# Patient Record
Sex: Male | Born: 1970
Health system: Southern US, Community
[De-identification: ages and names within clinical notes are randomized; demographics above are authoritative.]

## PROBLEM LIST (undated history)

## (undated) DIAGNOSIS — G4730 Sleep apnea, unspecified: Secondary | ICD-10-CM

## (undated) DIAGNOSIS — J45909 Unspecified asthma, uncomplicated: Secondary | ICD-10-CM

## (undated) DIAGNOSIS — T7840XA Allergy, unspecified, initial encounter: Secondary | ICD-10-CM

## (undated) DIAGNOSIS — E119 Type 2 diabetes mellitus without complications: Secondary | ICD-10-CM

## (undated) DIAGNOSIS — I10 Essential (primary) hypertension: Secondary | ICD-10-CM

## (undated) HISTORY — DX: Sleep apnea, unspecified: G47.30

## (undated) HISTORY — DX: Allergy, unspecified, initial encounter: T78.40XA

---
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
## 1999-01-22 ENCOUNTER — Emergency Department (HOSPITAL_COMMUNITY): Admission: EM | Admit: 1999-01-22 | Discharge: 1999-01-22 | Payer: Self-pay | Admitting: Emergency Medicine

## 1999-09-11 ENCOUNTER — Emergency Department (HOSPITAL_COMMUNITY): Admission: EM | Admit: 1999-09-11 | Discharge: 1999-09-11 | Payer: Self-pay | Admitting: Emergency Medicine

## 1999-09-11 ENCOUNTER — Encounter: Payer: Self-pay | Admitting: Emergency Medicine

## 2002-09-13 ENCOUNTER — Ambulatory Visit (HOSPITAL_BASED_OUTPATIENT_CLINIC_OR_DEPARTMENT_OTHER): Admission: RE | Admit: 2002-09-13 | Discharge: 2002-09-13 | Payer: Self-pay | Admitting: *Deleted

## 2003-03-21 HISTORY — PX: ACHILLES TENDON SURGERY: SHX542

## 2005-12-26 ENCOUNTER — Ambulatory Visit (HOSPITAL_BASED_OUTPATIENT_CLINIC_OR_DEPARTMENT_OTHER): Admission: RE | Admit: 2005-12-26 | Discharge: 2005-12-26 | Payer: Self-pay | Admitting: Orthopedic Surgery

## 2011-10-19 ENCOUNTER — Other Ambulatory Visit: Payer: Self-pay | Admitting: Podiatry

## 2011-10-19 DIAGNOSIS — S86009A Unspecified injury of unspecified Achilles tendon, initial encounter: Secondary | ICD-10-CM

## 2011-10-25 ENCOUNTER — Other Ambulatory Visit: Payer: Self-pay | Admitting: Podiatry

## 2011-10-25 DIAGNOSIS — S86009A Unspecified injury of unspecified Achilles tendon, initial encounter: Secondary | ICD-10-CM

## 2011-10-27 ENCOUNTER — Ambulatory Visit (HOSPITAL_COMMUNITY)
Admission: RE | Admit: 2011-10-27 | Discharge: 2011-10-27 | Disposition: A | Discharge status: Home / Self Care | Payer: 59 | Source: Ambulatory Visit | Attending: Podiatry | Admitting: Podiatry

## 2011-10-27 DIAGNOSIS — S8990XA Unspecified injury of unspecified lower leg, initial encounter: Secondary | ICD-10-CM | POA: Insufficient documentation

## 2011-10-27 DIAGNOSIS — S99919A Unspecified injury of unspecified ankle, initial encounter: Secondary | ICD-10-CM | POA: Insufficient documentation

## 2011-10-27 DIAGNOSIS — X58XXXA Exposure to other specified factors, initial encounter: Secondary | ICD-10-CM | POA: Insufficient documentation

## 2011-10-27 DIAGNOSIS — S86009A Unspecified injury of unspecified Achilles tendon, initial encounter: Secondary | ICD-10-CM

## 2011-10-27 DIAGNOSIS — M7730 Calcaneal spur, unspecified foot: Secondary | ICD-10-CM | POA: Insufficient documentation

## 2013-02-25 ENCOUNTER — Emergency Department (HOSPITAL_COMMUNITY): Payer: 59

## 2013-02-25 ENCOUNTER — Emergency Department (HOSPITAL_COMMUNITY)
Admission: EM | Admit: 2013-02-25 | Discharge: 2013-02-26 | Disposition: A | Discharge status: Home / Self Care | Payer: 59 | Attending: Emergency Medicine | Admitting: Emergency Medicine

## 2013-02-25 ENCOUNTER — Encounter (HOSPITAL_COMMUNITY): Payer: Self-pay | Admitting: Emergency Medicine

## 2013-02-25 DIAGNOSIS — S63619A Unspecified sprain of unspecified finger, initial encounter: Secondary | ICD-10-CM

## 2013-02-25 DIAGNOSIS — S6390XA Sprain of unspecified part of unspecified wrist and hand, initial encounter: Secondary | ICD-10-CM | POA: Insufficient documentation

## 2013-02-25 DIAGNOSIS — J45909 Unspecified asthma, uncomplicated: Secondary | ICD-10-CM | POA: Insufficient documentation

## 2013-02-25 DIAGNOSIS — Z79899 Other long term (current) drug therapy: Secondary | ICD-10-CM | POA: Insufficient documentation

## 2013-02-25 DIAGNOSIS — E119 Type 2 diabetes mellitus without complications: Secondary | ICD-10-CM | POA: Insufficient documentation

## 2013-02-25 DIAGNOSIS — Y9367 Activity, basketball: Secondary | ICD-10-CM | POA: Insufficient documentation

## 2013-02-25 DIAGNOSIS — W219XXA Striking against or struck by unspecified sports equipment, initial encounter: Secondary | ICD-10-CM | POA: Insufficient documentation

## 2013-02-25 DIAGNOSIS — Y9239 Other specified sports and athletic area as the place of occurrence of the external cause: Secondary | ICD-10-CM | POA: Insufficient documentation

## 2013-02-25 DIAGNOSIS — I10 Essential (primary) hypertension: Secondary | ICD-10-CM | POA: Insufficient documentation

## 2013-02-25 HISTORY — DX: Type 2 diabetes mellitus without complications: E11.9

## 2013-02-25 HISTORY — DX: Essential (primary) hypertension: I10

## 2013-02-25 HISTORY — DX: Unspecified asthma, uncomplicated: J45.909
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
## 2013-02-25 NOTE — ED Provider Notes (Signed)
CSN: [id number]     Arrival date & time 02/25/13  2219 History  This chart was scribed for Ivonne Andrew, PA, working with Shon Baton, MD, by Ardelia Mems ED Scribe. This patient was seen in room WTR8/WTR8 and the patient's care was started at 10:58 PM.   Chief Complaint  Patient presents with  . Finger Injury    The history is provided by the patient. No language interpreter was used.    HPI Comments: Drew Daniels is a right-handed 42 y.o. Male with a history of DM and HTN who presents to the Emergency Department complaining of a right fifth finger injury sustained 2 hours ago. Pain is moderate. He states that he "jammed" the finger while playing Basketball. He states that the finger has been "crooked" and difficult to move since the injury. He states that he has had intermittent pain only with moving his finger. He also states that he has not had full ROM of the finger since the injury. He has buddy taped the finger to the adjacent finger. No other treatments use. Denies any weakness or numbness to the finger. No other aggravating or alleviating factors. No other associated symptoms.  PCP- Dr. Sharyon Medicus   Past Medical History  Diagnosis Date  . Diabetes mellitus without complication   . Hypertension   . Asthma    Past Surgical History  Procedure Laterality Date  . Achilles tendon surgery Left 2005   History reviewed. No pertinent family history. History  Substance Use Topics  . Smoking status: Never Smoker   . Smokeless tobacco: Never Used  . Alcohol Use: No    Review of Systems  Musculoskeletal:       Right fifth finger pain.  All other systems reviewed and are negative.   Allergies  Pseudophed-chlophedianol-gg  Home Medications   Current Outpatient Rx  Name  Route  Sig  Dispense  Refill  . cholecalciferol (VITAMIN D) 1000 UNITS tablet   Oral   Take 1,000 Units by mouth daily.         . nebivolol (BYSTOLIC) 10 MG tablet   Oral   Take 10 mg by  mouth every evening.         . Olmesartan-Amlodipine-HCTZ (TRIBENZOR) 40-10-25 MG TABS   Oral   Take by mouth.         . sitaGLIPtin-metformin (JANUMET) 50-500 MG per tablet   Oral   Take 1 tablet by mouth 2 (two) times daily with a meal.          Triage Vitals: BP 131/66  Pulse 78  Temp(Src) 99 F (37.2 C) (Oral)  Resp 18  Ht 6' (1.829 m)  Wt 309 lb (140.161 kg)  BMI 41.90 kg/m2  SpO2 99%  Physical Exam  Nursing note and vitals reviewed. Constitutional: He is oriented to person, place, and time. He appears well-developed and well-nourished. No distress.  HENT:  Head: Normocephalic and atraumatic.  Eyes: EOM are normal.  Neck: Neck supple. No tracheal deviation present.  Cardiovascular: Normal rate.   Pulmonary/Chest: Effort normal. No respiratory distress.  Musculoskeletal: Normal range of motion.  Decreased range of motion of the right fifth digit. TVery mild swelling present. There is pain to the dorsal proximal finger. No metatarsal pain or deformity. Normal sensations to light touch. Normal capillary refill less than 2 seconds.  Neurological: He is alert and oriented to person, place, and time.  Skin: Skin is warm and dry.  Psychiatric: He has a normal mood  and affect. His behavior is normal.    ED Course  Procedures   DIAGNOSTIC STUDIES: Oxygen Saturation is 99% on RA, normal by my interpretation.    COORDINATION OF CARE: 11:02 PM- patient seen in 9 patient appears well no acute distress. Pt advised of plan for treatment and pt agrees.    Imaging Review Dg Finger Little Right  02/25/2013   CLINICAL DATA:  Injured finger while playing basketball. Pain in the base of the finger.  EXAM: RIGHT LITTLE FINGER 2+V  COMPARISON:  None.  FINDINGS: There is no evidence of fracture or dislocation. There is no evidence of arthropathy or other focal bone abnormality. Soft tissues are unremarkable.  IMPRESSION: Negative.   Electronically Signed   By: Rosalie Gums M.D.    On: 02/25/2013 23:38      MDM   1. Finger sprain, initial encounter       I personally performed the services described in this documentation, which was scribed in my presence. The recorded information has been reviewed and is accurate.   Angus Seller, PA-C 02/26/13 [phone]

## 2013-02-26 NOTE — ED Notes (Signed)
Patient is alert and oriented x3.  He was given DC instructions and follow up visit instructions.  Patient gave verbal understanding.  He was DC ambulatory under his own power to home.  V/S stable.  He was not showing any signs of distress on DC 

## 2013-02-26 NOTE — ED Provider Notes (Signed)
Medical screening examination/treatment/procedure(s) were performed by non-physician practitioner and as supervising physician I was immediately available for consultation/collaboration.  EKG Interpretation   None        Shon Baton, MD 02/26/13 [phone]

## 2014-08-23 ENCOUNTER — Encounter (HOSPITAL_COMMUNITY): Payer: Self-pay | Admitting: *Deleted

## 2014-08-23 ENCOUNTER — Emergency Department (INDEPENDENT_AMBULATORY_CARE_PROVIDER_SITE_OTHER)
Admission: EM | Admit: 2014-08-23 | Discharge: 2014-08-23 | Disposition: A | Discharge status: Home / Self Care | Payer: 59 | Source: Home / Self Care | Attending: Family Medicine | Admitting: Family Medicine

## 2014-08-23 DIAGNOSIS — L03012 Cellulitis of left finger: Secondary | ICD-10-CM | POA: Diagnosis not present

## 2014-08-23 DIAGNOSIS — IMO0001 Reserved for inherently not codable concepts without codable children: Secondary | ICD-10-CM

## 2014-08-23 MED ORDER — DOXYCYCLINE HYCLATE 100 MG PO CAPS: 100.0000 mg | ORAL_CAPSULE | Freq: Two times a day (BID) | ORAL | Status: DC

## 2014-08-23 NOTE — ED Notes (Signed)
Pain       And  Swelling  Of  The  Base  Of  His  l middle finger    For  sev  Days         He  Reports  He  Has been  Soaking  It  For     aboy  3  Days  Ago  And  Has  Had  Some   pus  Drainage

## 2014-08-23 NOTE — ED Provider Notes (Signed)
CSN: [id number]     Arrival date & time 08/23/14  1713 History   First MD Initiated Contact with Patient 08/23/14 1725     Chief Complaint  Patient presents with  . Hand Pain   (Consider location/radiation/quality/duration/timing/severity/associated sxs/prior Treatment) Patient is a 44 y.o. male presenting with hand pain. The history is provided by the patient.  Hand Pain This is a new problem. The current episode started 2 days ago (left middle fingertip pain and swelling.). The problem has been gradually worsening.    Past Medical History  Diagnosis Date  . Diabetes mellitus without complication   . Hypertension   . Asthma    Past Surgical History  Procedure Laterality Date  . Achilles tendon surgery Left 2005   No family history on file. History  Substance Use Topics  . Smoking status: Never Smoker   . Smokeless tobacco: Never Used  . Alcohol Use: No    Review of Systems  Constitutional: Negative.   Musculoskeletal: Positive for joint swelling.  Skin: Negative.     Allergies  Pseudophed-chlophedianol-gg  Home Medications   Prior to Admission medications   Medication Sig Start Date End Date Taking? Authorizing Provider  levocetirizine (XYZAL) 2.5 MG/5ML solution Take 2.5 mg by mouth every evening.   Yes Historical Provider, MD  cholecalciferol (VITAMIN D) 1000 UNITS tablet Take 1,000 Units by mouth daily.    Historical Provider, MD  doxycycline (VIBRAMYCIN) 100 MG capsule Take 1 capsule (100 mg total) by mouth 2 (two) times daily. 08/23/14   Billy Fischer, MD  nebivolol (BYSTOLIC) 10 MG tablet Take 10 mg by mouth every evening.    Historical Provider, MD  Olmesartan-Amlodipine-HCTZ (TRIBENZOR) 40-10-25 MG TABS Take by mouth.    Historical Provider, MD  sitaGLIPtin-metformin (JANUMET) 50-500 MG per tablet Take 1 tablet by mouth 2 (two) times daily with a meal.    Historical Provider, MD   There were no vitals taken for this visit. Physical Exam  Constitutional: He  is oriented to person, place, and time. He appears well-developed and well-nourished.  Musculoskeletal: He exhibits tenderness.       Hands: Neurological: He is alert and oriented to person, place, and time.  Skin: Skin is warm and dry. There is erythema.  Nursing note and vitals reviewed.   ED Course  INCISION AND DRAINAGE Date/Time: 08/23/2014 5:45 PM Performed by: Billy Fischer Authorized by: Ihor Gully D Consent: Verbal consent obtained. Risks and benefits: risks, benefits and alternatives were discussed Consent given by: patient Type: abscess Body area: upper extremity Location details: left long finger Local anesthetic: topical anesthetic Patient sedated: no Scalpel size: 11 Incision type: single straight Complexity: simple Drainage: purulent Drainage amount: moderate Wound treatment: wound left open Packing material: 1/4 in gauze Patient tolerance: Patient tolerated the procedure well with no immediate complications Comments: Culture obtained, betadine soaked.   (including critical care time) Labs Review Labs Reviewed  WOUND CULTURE    Imaging Review No results found.   MDM   1. Paronychia of third finger, left        Billy Fischer, MD 08/23/14 1750

## 2014-08-27 LAB — WOUND CULTURE: Gram Stain: NONE SEEN

## 2014-08-27 NOTE — ED Notes (Signed)
Final wound culture report shows organism identified sensitive to Rx written. No further action required

## 2015-01-08 IMAGING — CR DG FINGER LITTLE 2+V*R*
3 series · 3 of 3 positions shown · non-contrast
Comparison: None.

CLINICAL DATA: Injured finger while playing basketball. Pain in the
base of the finger.

EXAM:
RIGHT LITTLE FINGER 2+V

[x finger pa right]
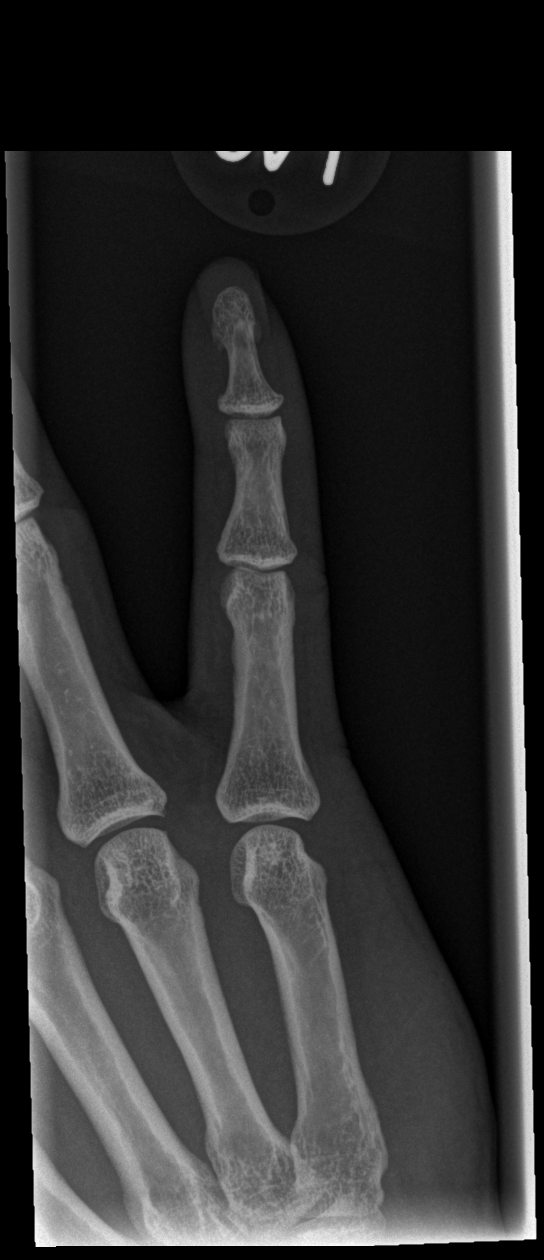

[x finger obl right]
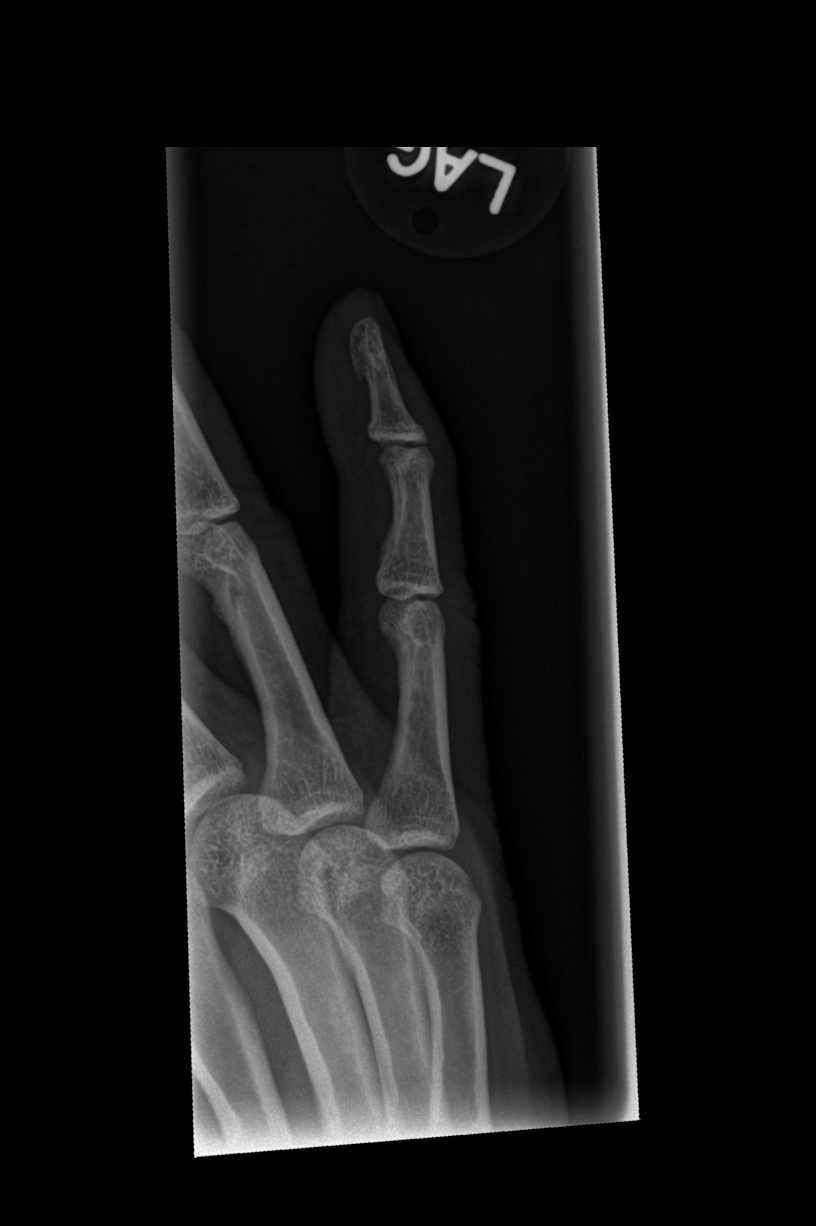

[x finger lat right]
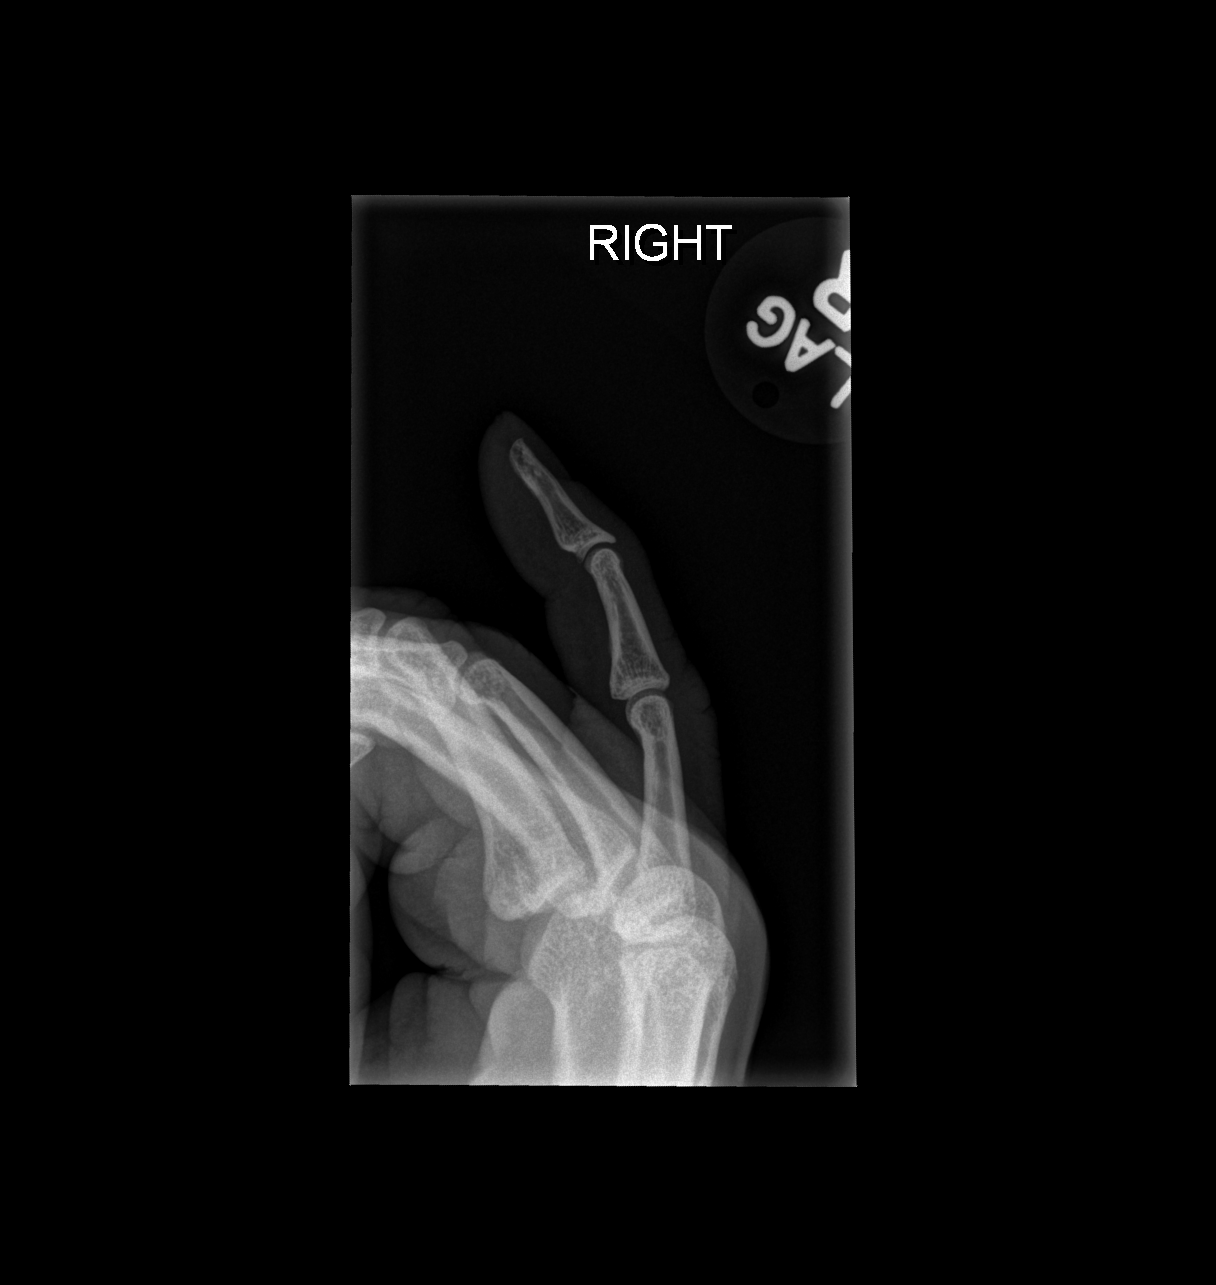

[3 of 3 positions shown; findings below may reference images not displayed]

FINDINGS: There is no evidence of fracture or dislocation. There is no
evidence of arthropathy or other focal bone abnormality. Soft
tissues are unremarkable.
IMPRESSION: Negative.

## 2016-07-11 DIAGNOSIS — Z6841 Body Mass Index (BMI) 40.0 and over, adult: Secondary | ICD-10-CM | POA: Diagnosis not present

## 2016-07-11 DIAGNOSIS — E1165 Type 2 diabetes mellitus with hyperglycemia: Secondary | ICD-10-CM | POA: Diagnosis not present

## 2016-07-11 DIAGNOSIS — Z79899 Other long term (current) drug therapy: Secondary | ICD-10-CM | POA: Diagnosis not present

## 2016-07-11 DIAGNOSIS — I10 Essential (primary) hypertension: Secondary | ICD-10-CM | POA: Diagnosis not present

## 2016-08-03 DIAGNOSIS — I10 Essential (primary) hypertension: Secondary | ICD-10-CM | POA: Diagnosis not present

## 2016-08-17 DIAGNOSIS — Z6841 Body Mass Index (BMI) 40.0 and over, adult: Secondary | ICD-10-CM | POA: Diagnosis not present

## 2016-08-17 DIAGNOSIS — I10 Essential (primary) hypertension: Secondary | ICD-10-CM | POA: Diagnosis not present

## 2016-10-18 DIAGNOSIS — E119 Type 2 diabetes mellitus without complications: Secondary | ICD-10-CM | POA: Diagnosis not present

## 2016-10-18 DIAGNOSIS — I10 Essential (primary) hypertension: Secondary | ICD-10-CM | POA: Diagnosis not present

## 2016-10-18 DIAGNOSIS — Z79899 Other long term (current) drug therapy: Secondary | ICD-10-CM | POA: Diagnosis not present

## 2016-11-15 DIAGNOSIS — Z6841 Body Mass Index (BMI) 40.0 and over, adult: Secondary | ICD-10-CM | POA: Diagnosis not present

## 2016-11-15 DIAGNOSIS — E118 Type 2 diabetes mellitus with unspecified complications: Secondary | ICD-10-CM | POA: Diagnosis not present

## 2016-11-15 DIAGNOSIS — I10 Essential (primary) hypertension: Secondary | ICD-10-CM | POA: Diagnosis not present

## 2016-11-23 DIAGNOSIS — Z6841 Body Mass Index (BMI) 40.0 and over, adult: Secondary | ICD-10-CM | POA: Diagnosis not present

## 2016-11-23 DIAGNOSIS — Z713 Dietary counseling and surveillance: Secondary | ICD-10-CM | POA: Diagnosis not present

## 2016-12-28 DIAGNOSIS — E118 Type 2 diabetes mellitus with unspecified complications: Secondary | ICD-10-CM | POA: Diagnosis not present

## 2016-12-28 DIAGNOSIS — Z6841 Body Mass Index (BMI) 40.0 and over, adult: Secondary | ICD-10-CM | POA: Diagnosis not present

## 2017-01-18 DIAGNOSIS — Z125 Encounter for screening for malignant neoplasm of prostate: Secondary | ICD-10-CM | POA: Diagnosis not present

## 2017-01-18 DIAGNOSIS — I10 Essential (primary) hypertension: Secondary | ICD-10-CM | POA: Diagnosis not present

## 2017-01-18 DIAGNOSIS — Z79899 Other long term (current) drug therapy: Secondary | ICD-10-CM | POA: Diagnosis not present

## 2017-01-18 DIAGNOSIS — Z0000 Encounter for general adult medical examination without abnormal findings: Secondary | ICD-10-CM | POA: Diagnosis not present

## 2017-01-18 DIAGNOSIS — J4520 Mild intermittent asthma, uncomplicated: Secondary | ICD-10-CM | POA: Diagnosis not present

## 2017-01-18 DIAGNOSIS — E119 Type 2 diabetes mellitus without complications: Secondary | ICD-10-CM | POA: Diagnosis not present

## 2017-01-25 DIAGNOSIS — Z6841 Body Mass Index (BMI) 40.0 and over, adult: Secondary | ICD-10-CM | POA: Diagnosis not present

## 2017-01-25 DIAGNOSIS — Z713 Dietary counseling and surveillance: Secondary | ICD-10-CM | POA: Diagnosis not present

## 2017-05-02 DIAGNOSIS — I10 Essential (primary) hypertension: Secondary | ICD-10-CM | POA: Diagnosis not present

## 2017-05-02 DIAGNOSIS — G4726 Circadian rhythm sleep disorder, shift work type: Secondary | ICD-10-CM | POA: Diagnosis not present

## 2017-05-02 DIAGNOSIS — E1165 Type 2 diabetes mellitus with hyperglycemia: Secondary | ICD-10-CM | POA: Diagnosis not present

## 2017-05-02 DIAGNOSIS — L259 Unspecified contact dermatitis, unspecified cause: Secondary | ICD-10-CM | POA: Diagnosis not present

## 2017-09-06 DIAGNOSIS — R0683 Snoring: Secondary | ICD-10-CM | POA: Diagnosis not present

## 2017-09-06 DIAGNOSIS — I10 Essential (primary) hypertension: Secondary | ICD-10-CM | POA: Diagnosis not present

## 2017-09-06 DIAGNOSIS — E1169 Type 2 diabetes mellitus with other specified complication: Secondary | ICD-10-CM | POA: Diagnosis not present

## 2017-09-26 DIAGNOSIS — I10 Essential (primary) hypertension: Secondary | ICD-10-CM | POA: Diagnosis not present

## 2017-09-26 DIAGNOSIS — E559 Vitamin D deficiency, unspecified: Secondary | ICD-10-CM | POA: Diagnosis not present

## 2017-09-26 DIAGNOSIS — Z79899 Other long term (current) drug therapy: Secondary | ICD-10-CM | POA: Diagnosis not present

## 2017-11-14 ENCOUNTER — Encounter: Payer: Self-pay | Admitting: Neurology

## 2017-11-21 ENCOUNTER — Encounter: Payer: Self-pay | Admitting: Neurology

## 2017-11-21 ENCOUNTER — Ambulatory Visit (INDEPENDENT_AMBULATORY_CARE_PROVIDER_SITE_OTHER): Payer: BLUE CROSS/BLUE SHIELD | Admitting: Neurology

## 2017-11-21 VITALS — BP 154/87 | HR 58 | Ht 73.0 in | Wt 317.0 lb

## 2017-11-21 DIAGNOSIS — G4726 Circadian rhythm sleep disorder, shift work type: Secondary | ICD-10-CM | POA: Diagnosis not present

## 2017-11-21 DIAGNOSIS — Z6841 Body Mass Index (BMI) 40.0 and over, adult: Secondary | ICD-10-CM | POA: Insufficient documentation

## 2017-11-21 DIAGNOSIS — G4719 Other hypersomnia: Secondary | ICD-10-CM | POA: Insufficient documentation

## 2017-11-21 DIAGNOSIS — I10 Essential (primary) hypertension: Secondary | ICD-10-CM | POA: Insufficient documentation

## 2017-11-21 DIAGNOSIS — Z72820 Sleep deprivation: Secondary | ICD-10-CM | POA: Insufficient documentation

## 2017-11-21 DIAGNOSIS — R0683 Snoring: Secondary | ICD-10-CM

## 2017-11-21 DIAGNOSIS — R0601 Orthopnea: Secondary | ICD-10-CM | POA: Insufficient documentation

## 2017-11-21 NOTE — Patient Instructions (Signed)

## 2017-11-21 NOTE — Progress Notes (Signed)
SLEEP MEDICINE CLINIC   Provider:  Larey Seat, M D  Primary Care Physician:  Glendale Chard, MD   Referring Provider:  Glendale Chard, MD    Chief Complaint  Patient presents with  . New Patient (Initial Visit)    pt alone, rm 10 pt states that he sometimes is tired during the daytime. pt has a extensive work schedule. pt states he snores. pt had a sleep study 11-12 years ago. at that time he was told that he had apnea and he was started on a CPAP machine used it for 8-12 months and then stopped.     HPI:  Drew Daniels is a 47 y.o. male, seen here in a referral from Dr. Baird Cancer, struggling with morbid obesity, and pre diabetes. He has had a sleep study in [place] 15 years ago, at [facility].  He struggles with loud snoring especially when he has a congested nasal pathway.  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   Chief complaint according to patient :" I am unable to lose weight and I snore"  Sleep habits are as follows: He returns from night shift at 6 AM and is in bed from 8-12 noon. His main job is with the city of [place], 36-40 hours.   His daughters return on the school bus by 3.30 PM and 5 PM. He tries to help with homework and struggles to get another nap in. On Monday he works for [employer] between 9 AM and 3 PM, and working  35-40 hours there, too. Wednesdays he gets off from N and R and sleeps from 4 AM to 11 AM.  Average 4-5 hours of daytime sleep.  He is severely sleep deprived.  His wife insists on TV in the bedroom, he uses a sleep mask and headphones to protect his sleep.  He sleeps on his back-and side. Uses 4 pillows, almost sitting up.    Sleep medical history and family sleep history: OSA has not affected family members, but he suspects his mother is affected. Was a thrid shift worker for 30 years. She sleeps in daytime.   Social history: working multiple jobs, night shift for  over 3 month -  married, 2 daughters. Wife works as a Education officer, museum in daytime, [place]. His eldest son is 77 and lives away from home.  Non smoker, non vapor,  caffeine - sodas , coke 2 a week, iced tea - none, coffee - none, no energy drinks.     ETOH - none.   Review of Systems: runny nose, sleepiness, acid reflux.   Out of a complete 14 system review, the patient complains of only the following symptoms, and all other reviewed systems are negative.  How likely are you to doze in the following situations: 0 = not likely, 1 = slight chance, 2 = moderate chance, 3 = high chance  Sitting and Reading? 2Watching Television?2 Sitting inactive in a public place (theater or meeting)?2 Lying down in the afternoon when circumstances permit?3 Sitting and talking to someone?0 but falls asleep in conferences.  Sitting quietly after lunch without alcohol?2 In a car, while stopped for a few minutes in traffic?1 As a passenger in a car for an hour without a break?3  Total = 18/ 24    Epworth score se above -11-21-2017 Fatigue severity score 22  ,  depression score 22/ 63.  Social History   Socioeconomic History  . Marital status: Married    Spouse name: Not on file  . Number of children: Not on file  . Years of education: Not on file  . Highest education level: Not on file  Occupational History  . Not on file  Social Needs  . Financial resource strain: Not on file  . Food insecurity:    Worry: Not on file    Inability: Not on file  . Transportation needs:    Medical: Not on file    Non-medical: Not on file  Tobacco Use  . Smoking status: Never Smoker  . Smokeless tobacco: Never Used  Substance and Sexual Activity  . Alcohol use: No  . Drug use: Yes  . Sexual activity: Never  Lifestyle  . Physical activity:    Days per week: Not on file    Minutes per session: Not on file  . Stress: Not on file  Relationships  . Social connections:    Talks on phone: Not on file     Gets together: Not on file    Attends religious service: Not on file    Active member of club or organization: Not on file    Attends meetings of clubs or organizations: Not on file    Relationship status: Not on file  . Intimate partner violence:    Fear of current or ex partner: Not on file    Emotionally abused: Not on file    Physically abused: Not on file    Forced sexual activity: Not on file  Other Topics Concern  . Not on file  Social History Narrative  . Not on file    No family history on file.  Past Medical History:  Diagnosis Date  . Asthma   . Diabetes mellitus without complication ([place])   . Hypertension     Past Surgical History:  Procedure Laterality Date  . ACHILLES TENDON SURGERY Left 2005    Current Outpatient Medications  Medication Sig Dispense Refill  . albuterol (PROAIR HFA) 108 (90 Base) MCG/ACT inhaler Inhale 2 puffs into the lungs every 6 (six) hours as needed for wheezing or shortness of breath.    Marland Kitchen amLODipine (NORVASC) 5 MG tablet Take 5 mg by mouth daily.    . ergocalciferol (VITAMIN D2) 50000 units capsule Take 50,000 Units by mouth once a week.    . fexofenadine (ALLEGRA) 180 MG tablet Take 180 mg by mouth daily.    Marland Kitchen telmisartan-hydrochlorothiazide (MICARDIS HCT) 80-12.5 MG tablet Take 1 tablet by mouth daily.     No current facility-administered medications for this visit.     Allergies as of 11/21/2017 - Review Complete 11/21/2017  Allergen Reaction Noted  . Pseudophed-chlophedianol-gg Nausea And Vomiting 02/25/2013    Vitals: BP (!) 154/87   Pulse (!) 58   Ht 6\' 1"  (1.854 m)   Wt (!) 317 lb (143.8 kg)   BMI 41.82 kg/m  Last Weight:  Wt Readings from Last 1 Encounters:  11/21/17 (!) 317 lb (143.8 kg)   DVV:OHYW mass index is 41.82 kg/m.     Last Height:   Ht Readings from Last 1 Encounters:  11/21/17 6\' 1"  (1.854 m)    Physical exam:  General: The patient is awake, alert and appears not in acute distress. The  patient is well groomed. Head: Normocephalic, atraumatic. Neck is supple. Mallampati 5,  neck circumference:19" . Nasal airflow congested ,  Retrognathia is mild .  Facial hair is noted.  Cardiovascular:  Regular rate and rhythm, without  murmurs or carotid bruit, and without distended neck veins. Respiratory: Lungs are clear to auscultation. Skin:  Without evidence of edema, or rash Trunk: BMI is 42. The patient's posture is slouched.   Neurologic exam : The patient is drowsy, oriented to place and time.   Memory subjective described as intact. Attention span & concentration ability appears normal.  Speech is fluent, without dysarthria, dysphonia or aphasia.  Mood and affect are appropriate.  Cranial nerves: Pupils are equal and briskly reactive to light. Funduscopic exam without evidence of pallor or edema. Extraocular movements  in vertical and horizontal planes intact and without nystagmus. Visual fields by finger perimetry are intact. Hearing to finger rub intact.  Facial sensation intact to fine touch. Facial motor strength is symmetric and tongue and uvula move midline. Shoulder shrug was symmetrical.   Motor exam:  Normal tone, muscle bulk and symmetric strength in all extremities. Sensory:  Fine touch, pinprick and vibration were tested in all extremities. Proprioception tested in the upper extremities was normal. Coordination: Rapid alternating movements in the fingers/hands was normal. Finger-to-nose maneuver  normal without evidence of ataxia, dysmetria or tremor. Gait and station: Patient walks without assistive device and has a wide based gait , he is able unassisted to climb up to the exam table. Turns with 4 Steps. Romberg testing is  Negative. He walks slightly stooped. He uses special shoe inserts.  Deep tendon reflexes: in the  upper and lower extremities are symmetric and intact. Babinski maneuver response is equivocal - downgoing.  Assessment:  After physical and  neurologic examination, review of laboratory studies,  Personal review of imaging studies, reports of other /same  Imaging studies, results of polysomnography and / or neurophysiology testing and pre-existing records as far as provided in visit., my assessment is   1) Drew Daniels is very, very sleep deprived and has no protected tie or space for the needed sleep in daytime. His wife has a very different rhythm and he would need a separate bedchamber , which needs to be cool, quiet and dark. He needs 6 hours of sleep minimum. He can't make up for sleep on weekends.   2) Snoring in a patient with obesity , diabetic HbA1c and his large neck and upper airway.   3) excessive daytime sleepiness- dangerous level of sleepiness, not fatigue- will need a shift work adjusted sleep study in daytime.   The patient was advised of the nature of the diagnosed disorder , the treatment options and the  risks for general health and wellness arising from not treating the condition.   I spent more than 50 minutes of face to face time with the patient.  Greater than 50% of time was spent in counseling and coordination of care. We have discussed the diagnosis and differential and I answered the patient's questions.    Plan:  Treatment plan and additional workup :  Attended daytime SPLIT night PSG, in a patient with circadian rhythm disorder, which alone predisposes him to become obese , diabetic and hypertensive.  I gave him 2 hand outs about insomnia- truely it is needed to get sleep hygiene implemented to allow sleep , set time apart. We discussed road safety , work safety in the setting of this degree of EDS.      Larey Seat, MD 08/26/6293, 2:84 AM  Certified in Neurology by ABPN Certified in Sleep Medicine by [facility] [address], [place],  [place] [address]

## 2017-11-28 ENCOUNTER — Telehealth: Payer: Self-pay

## 2017-11-28 ENCOUNTER — Other Ambulatory Visit: Payer: Self-pay | Admitting: Neurology

## 2017-11-28 DIAGNOSIS — R0601 Orthopnea: Secondary | ICD-10-CM

## 2017-11-28 DIAGNOSIS — Z6841 Body Mass Index (BMI) 40.0 and over, adult: Principal | ICD-10-CM

## 2017-11-28 DIAGNOSIS — G4726 Circadian rhythm sleep disorder, shift work type: Secondary | ICD-10-CM

## 2017-11-28 DIAGNOSIS — Z72820 Sleep deprivation: Secondary | ICD-10-CM

## 2017-11-28 DIAGNOSIS — R0683 Snoring: Secondary | ICD-10-CM

## 2017-11-28 DIAGNOSIS — I10 Essential (primary) hypertension: Secondary | ICD-10-CM

## 2017-11-28 DIAGNOSIS — G4719 Other hypersomnia: Secondary | ICD-10-CM

## 2017-11-28 NOTE — Telephone Encounter (Signed)
Order placed for HST 

## 2017-11-28 NOTE — Telephone Encounter (Signed)
Insurance has denied in lab sleep study request. Do you want to order a HST? 

## 2017-12-19 ENCOUNTER — Ambulatory Visit: Payer: BLUE CROSS/BLUE SHIELD | Admitting: Neurology

## 2017-12-19 DIAGNOSIS — R0683 Snoring: Secondary | ICD-10-CM

## 2017-12-19 DIAGNOSIS — G4726 Circadian rhythm sleep disorder, shift work type: Secondary | ICD-10-CM

## 2017-12-19 DIAGNOSIS — R0601 Orthopnea: Secondary | ICD-10-CM

## 2017-12-19 DIAGNOSIS — Z6841 Body Mass Index (BMI) 40.0 and over, adult: Secondary | ICD-10-CM

## 2017-12-19 DIAGNOSIS — I10 Essential (primary) hypertension: Secondary | ICD-10-CM

## 2017-12-19 DIAGNOSIS — G4733 Obstructive sleep apnea (adult) (pediatric): Secondary | ICD-10-CM | POA: Diagnosis not present

## 2017-12-19 DIAGNOSIS — G4719 Other hypersomnia: Secondary | ICD-10-CM

## 2017-12-19 DIAGNOSIS — Z72820 Sleep deprivation: Secondary | ICD-10-CM

## 2017-12-26 NOTE — Procedures (Signed)
NAME:  Drew Daniels.  [id number]                                    DOB: Aug 19, 1970 DOS: 12/19/2017  REFERRING PHYSICIAN: Glendale Chard, MD STUDY PERFORMED: Home Sleep Study on watch pat  HISTORY: Chief complaint according to patient :" I am unable to lose weight and I snore". [name] is a 47 y.o. male, struggling with morbid obesity and pre diabetes. He has had a sleep study in [place] 15 years ago, at [facility].  He struggles with loud snoring especially when he has a congested nasal pathway.  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   Average 4-5 hours of daytime sleep. He is severely sleep deprived.  His wife insists on TV in the bedroom, he uses a sleep mask and headphones to protect his sleep.  He sleeps on his back-and side. Uses 4 pillows, almost sitting up. Endorsed the Epworth score at 18/ 24 points; Fatigue severity score 22/63 points; BMI: 42.1  STUDY RESULTS:  Total Recording Time: 8 hours 43 minutes; valid 6 h and 57 min.  Total Apnea/Hypopnea Index (AHI): 34.4 /h; RDI:  29.4 /h. REM AHI 43.7/h Average Oxygen Saturation: 95 %; Lowest Oxygen Saturation: 85%  Total Time Oxygen Saturation below 89 %: 1.4 minutes.  Average Heart Rate:  55 bpm (between 42 and 115 bpm).  IMPRESSION:  Severe Obstructive Sleep Apnea.  Mild REM accentuation.  Clinically insignificant hypoxemia.  Ongoing tachy-bradycardia.  RECOMMENDATION: CPAP therapy is indicated. The degree of apnea does cause brady- and tachycardia, a sign of physiological stress. Weight loss is also recommended.  I certify that I have reviewed the raw data recording prior to the issuance of this report in accordance with the standards of the American Academy of Sleep Medicine (AASM). Larey Seat, M.D.   12-26-2017    Medical Director of [facility] at [facility], accredited by the AASM. Diplomat of the ABPN and ABSM.

## 2017-12-26 NOTE — Addendum Note (Signed)
Addended by: Larey Seat on: 12/26/2017 06:01 PM   Modules accepted: Orders

## 2017-12-27 ENCOUNTER — Telehealth: Payer: Self-pay | Admitting: Neurology

## 2017-12-27 NOTE — Telephone Encounter (Signed)

## 2017-12-27 NOTE — Telephone Encounter (Signed)
-----   Message from Larey Seat, MD sent at 12/26/2017  6:01 PM EDT ----- IMPRESSION:  Severe Obstructive Sleep Apnea.  Mild REM accentuation. Clinically insignificant hypoxemia.  Ongoing tachy-bradycardia.  RECOMMENDATION: CPAP therapy is indicated. The degree of apnea  does cause brady- and tachycardia, a sign of physiological  stress. Weight loss is also recommended.

## 2018-01-01 ENCOUNTER — Telehealth: Payer: Self-pay

## 2018-01-01 NOTE — Telephone Encounter (Signed)
[employer] denied cpap sleep study. They want an auto cpap first and if patient fails they will approve an in lab study.

## 2018-01-01 NOTE — Telephone Encounter (Signed)
5-16 cm water , 2 cm EPR.

## 2018-01-02 ENCOUNTER — Telehealth: Payer: Self-pay | Admitting: Neurology

## 2018-01-02 ENCOUNTER — Other Ambulatory Visit: Payer: Self-pay | Admitting: Internal Medicine

## 2018-01-02 ENCOUNTER — Other Ambulatory Visit: Payer: Self-pay | Admitting: Neurology

## 2018-01-02 DIAGNOSIS — R0683 Snoring: Secondary | ICD-10-CM

## 2018-01-02 DIAGNOSIS — R0601 Orthopnea: Secondary | ICD-10-CM

## 2018-01-02 DIAGNOSIS — I10 Essential (primary) hypertension: Secondary | ICD-10-CM

## 2018-01-02 DIAGNOSIS — Z72820 Sleep deprivation: Secondary | ICD-10-CM

## 2018-01-02 DIAGNOSIS — Z6841 Body Mass Index (BMI) 40.0 and over, adult: Secondary | ICD-10-CM

## 2018-01-02 DIAGNOSIS — G4719 Other hypersomnia: Secondary | ICD-10-CM

## 2018-01-02 DIAGNOSIS — G4726 Circadian rhythm sleep disorder, shift work type: Secondary | ICD-10-CM

## 2018-01-02 NOTE — Telephone Encounter (Signed)
Order placed

## 2018-01-02 NOTE — Telephone Encounter (Signed)
Called to advise the pt that insurance will not cover for the patient to come back for the titration test. Dr Dohmeier will order a auto CPAP for the patient to help with treating of his apnea. No answer. LVM informing the patient to call back so I can review what this means and also set him up with a company and follow up apt date.

## 2018-01-08 ENCOUNTER — Telehealth: Payer: Self-pay | Admitting: Neurology

## 2018-01-08 NOTE — Telephone Encounter (Signed)
Called the patient back. Advised the patient that insruance denied the cpap titration test. Informed him that Dr Brett Fairy would like to start a auto CPAP for him. Pt is agreeable to starting a CPAP. I advised pt that an order will be sent to a DME, [facility], and [facility] will call the pt within about one week after they file with the pt's insurance. [facility] will show the pt how to use the machine, fit for masks, and troubleshoot the CPAP if needed. A follow up appt was made for insurance purposes with Ward Givens, NP on Dec 20,2019 at 11:00 am. Pt verbalized understanding to arrive 15 minutes early and bring their CPAP. A letter with all of this information in it will be mailed to the pt as a reminder. I verified with the pt that the address we have on file is correct. Pt verbalized understanding of results. Pt had no questions at this time but was encouraged to call back if questions arise. I have sent the order to [facility] and have received confirmation that they have received the order.

## 2018-01-08 NOTE — Telephone Encounter (Signed)
Pt lvm asking for a call back regarding auto Cpap. Pt asked to be called back at [phone].

## 2018-02-13 NOTE — Telephone Encounter (Signed)
Received a notification that the patient has not started CPAP from [facility].  01/29/18 - LVM  02/01/18 - LVM  02/12/18 - LVM Pt will be responsible of calling them back at this time since they have left multiple messages.

## 2018-03-04 ENCOUNTER — Ambulatory Visit: Payer: BLUE CROSS/BLUE SHIELD | Admitting: Internal Medicine

## 2018-03-04 ENCOUNTER — Encounter: Payer: Self-pay | Admitting: Internal Medicine

## 2018-03-04 VITALS — BP 136/94 | HR 59 | Temp 98.1°F | Ht 70.0 in | Wt 315.8 lb

## 2018-03-04 DIAGNOSIS — Z0000 Encounter for general adult medical examination without abnormal findings: Secondary | ICD-10-CM | POA: Diagnosis not present

## 2018-03-04 DIAGNOSIS — M79644 Pain in right finger(s): Secondary | ICD-10-CM | POA: Diagnosis not present

## 2018-03-04 DIAGNOSIS — Z6841 Body Mass Index (BMI) 40.0 and over, adult: Secondary | ICD-10-CM | POA: Diagnosis not present

## 2018-03-04 DIAGNOSIS — I10 Essential (primary) hypertension: Secondary | ICD-10-CM | POA: Diagnosis not present

## 2018-03-04 DIAGNOSIS — E1165 Type 2 diabetes mellitus with hyperglycemia: Secondary | ICD-10-CM | POA: Diagnosis not present

## 2018-03-04 LAB — POCT URINALYSIS DIPSTICK
BILIRUBIN UA: NEGATIVE
GLUCOSE UA: NEGATIVE
KETONES UA: NEGATIVE
Leukocytes, UA: NEGATIVE
Nitrite, UA: NEGATIVE
Protein, UA: NEGATIVE
RBC UA: NEGATIVE
Spec Grav, UA: 1.02 (ref 1.010–1.025)
UROBILINOGEN UA: 0.2 U/dL
pH, UA: 7 (ref 5.0–8.0)

## 2018-03-04 LAB — POCT UA - MICROALBUMIN
Albumin/Creatinine Ratio, Urine, POC: 30
Creatinine, POC: 200 mg/dL
Microalbumin Ur, POC: 10 mg/L

## 2018-03-04 NOTE — Patient Instructions (Signed)

## 2018-03-04 NOTE — Progress Notes (Signed)
Subjective:     Patient ID: Drew Daniels , male    DOB: 07/17/1970 , 47 y.o.   MRN: [id number]   Chief Complaint  Patient presents with  . Annual Exam  . Diabetes  . Hypertension    HPI  He is here today for a full physical exam. He has no specific concerns or complaints at this time.   Diabetes  He presents for his follow-up diabetic visit. He has type 2 diabetes mellitus. His disease course has been stable. There are no hypoglycemic associated symptoms. Pertinent negatives for hypoglycemia include no headaches. Pertinent negatives for diabetes include no blurred vision and no chest pain. There are no hypoglycemic complications. Risk factors for coronary artery disease include diabetes mellitus, dyslipidemia, hypertension, obesity, male sex and sedentary lifestyle.  Hypertension  This is a chronic problem. The current episode started more than 1 year ago. The problem has been gradually improving since onset. The problem is controlled. Pertinent negatives include no blurred vision, chest pain, headaches, palpitations or shortness of breath.   He reports compliance with meds.   Past Medical History:  Diagnosis Date  . Asthma   . Diabetes mellitus without complication ([place])   . Hypertension      Family History  Problem Relation Age of Onset  . Healthy Mother   . Lung cancer Father   . Liver cancer Father   . Diabetes Other      Current Outpatient Medications:  .  albuterol (PROAIR HFA) 108 (90 Base) MCG/ACT inhaler, Inhale 2 puffs into the lungs every 6 (six) hours as needed for wheezing or shortness of breath., Disp: , Rfl:  .  amLODipine (NORVASC) 5 MG tablet, Take 5 mg by mouth daily., Disp: , Rfl:  .  ergocalciferol (VITAMIN D2) 50000 units capsule, Take 50,000 Units by mouth once a week., Disp: , Rfl:  .  fexofenadine (ALLEGRA) 180 MG tablet, TAKE 1 TABLET BY MOUTH EVERY DAY, Disp: 30 tablet, Rfl: 5 .  telmisartan-hydrochlorothiazide (MICARDIS HCT) 80-12.5 MG tablet,  Take 1 tablet by mouth daily., Disp: , Rfl:  .  Semaglutide,0.25 or 0.5MG/DOS, (OZEMPIC, 0.25 OR 0.5 MG/DOSE,) 2 MG/1.5ML SOPN, Inject into the skin., Disp: , Rfl:    Allergies  Allergen Reactions  . Pseudophed-Chlophedianol-Gg Nausea And Vomiting    Men's preventive visit. Patient Health Questionnaire (PHQ-2) is    Office Visit from 03/04/2018 in [facility]  PHQ-2 Total Score  0    . Patient is on a diabeticdiet. However, admits to drinking sodas and juices. Marital status: Married. Relevant history for alcohol use is:  Social History   Substance and Sexual Activity  Alcohol Use No  . Relevant history for tobacco use is:  Social History   Tobacco Use  Smoking Status Never Smoker  Smokeless Tobacco Never Used  .  Review of Systems  Constitutional: Negative.   HENT: Negative.   Eyes: Negative.  Negative for blurred vision.  Respiratory: Negative.  Negative for shortness of breath.   Cardiovascular: Negative.  Negative for chest pain and palpitations.  Gastrointestinal: Negative.   Endocrine: Negative.   Genitourinary: Negative.   Musculoskeletal: Positive for arthralgias (he c/o r thumb pain. states it started hurting a week ago. he awakened w/ thumb discomfort. He denies fall/trauma. the sx have since resolved. ).  Skin: Negative.   Allergic/Immunologic: Negative.   Neurological: Negative.  Negative for headaches.  Hematological: Negative.   Psychiatric/Behavioral: Negative.      Today's Vitals  03/04/18 0957  BP: (!) 136/94  Pulse: (!) 59  Temp: 98.1 F (36.7 C)  TempSrc: Oral  Weight: (!) 315 lb 12.8 oz (143.2 kg)  Height: _0  (1.778 m)   Body mass index is 45.31 kg/m.   Objective:  Physical Exam Vitals signs and nursing note reviewed.  Constitutional:      Appearance: Normal appearance. He is obese.  HENT:     Head: Normocephalic and atraumatic.     Right Ear: Tympanic membrane, ear canal and external ear normal. There is no  impacted cerumen.     Left Ear: Tympanic membrane, ear canal and external ear normal. There is no impacted cerumen.     Nose: Nose normal.  Neck:     Musculoskeletal: Normal range of motion.  Cardiovascular:     Rate and Rhythm: Normal rate and regular rhythm.     Pulses: Normal pulses.     Heart sounds: Normal heart sounds.  Pulmonary:     Effort: Pulmonary effort is normal.     Breath sounds: Normal breath sounds.  Chest:     Breasts:        Right: Normal. No swelling, bleeding, inverted nipple, mass or nipple discharge.        Left: Normal. No swelling, bleeding, inverted nipple, mass or nipple discharge.  Abdominal:     General: Abdomen is flat. Bowel sounds are normal.     Palpations: Abdomen is soft.     Comments: Obese. Difficult to assess organomegaly due to body habitus  Genitourinary:    Comments: Deferred as per patient Musculoskeletal: Normal range of motion.  Skin:    General: Skin is warm and dry.  Neurological:     General: No focal deficit present.     Mental Status: He is alert and oriented to person, place, and time.  Psychiatric:        Mood and Affect: Mood normal.         Assessment And Plan:     1. Routine general medical examination at health care facility  A full exam was performed. He is encouraged to incorporate more exercise into her daily routine.   - HIV antibody (with reflex) - CMP14+EGFR - CBC - Lipid panel - Hemoglobin A1c  2. Uncontrolled type 2 diabetes mellitus with hyperglycemia (HCC)  Diabetic foot exam was performed. We discussed the use of GLP-1 agents to help with glycemic control and possibly weight loss.  He denies family history of thyroid cancer. Possible side effects were discussed with the patient in detail.  He agrees to start Ozempic, 0.22m once weekly. He was instructed on how to self-administer the medication. He verbally understands that he will inject himself once weekly on Mondays. All questions were answered to his  satisfaction. He will rto in four weeks for re-evaluation. I DISCUSSED WITH THE PATIENT AT LENGTH REGARDING THE GOALS OF GLYCEMIC CONTROL AND POSSIBLE LONG-TERM COMPLICATIONS.  I  ALSO STRESSED THE IMPORTANCE OF COMPLIANCE WITH HOME GLUCOSE MONITORING, DIETARY RESTRICTIONS INCLUDING AVOIDANCE OF SUGARY DRINKS/PROCESSED FOODS,  ALONG WITH REGULAR EXERCISE.  I  ALSO STRESSED THE IMPORTANCE OF ANNUAL EYE EXAMS, SELF FOOT CARE AND COMPLIANCE WITH OFFICE VISITS.   3. Essential hypertension, benign  Fair control. He will continue with current meds for now. He is encouraged to avoid adding salt to his foods. EKG performed w/o acute changes.   - POCT Urinalysis Dipstick (81002) - POCT UA - Microalbumin - EKG 12-Lead  4. Thumb pain, right  This  has since resolved. May have been tendonitis.   5. Class 3 severe obesity due to excess calories with serious comorbidity and body mass index (BMI) of 45.0 to 49.9 in adult [facility])  He is encouraged to strive for BMI less than 38 to decrease cardiac risk. Again, importance of regular exercise was discussed to the patient. He is encouraged to exercise 30 minutes five days weekly.   [name], MD

## 2018-03-05 LAB — CMP14+EGFR
ALK PHOS: 74 IU/L (ref 39–117)
ALT: 20 IU/L (ref 0–44)
AST: 18 IU/L (ref 0–40)
Albumin/Globulin Ratio: 1.3 (ref 1.2–2.2)
Albumin: 3.9 g/dL (ref 3.5–5.5)
BUN/Creatinine Ratio: 13 (ref 9–20)
BUN: 14 mg/dL (ref 6–24)
Bilirubin Total: 0.4 mg/dL (ref 0.0–1.2)
CALCIUM: 9.1 mg/dL (ref 8.7–10.2)
CO2: 22 mmol/L (ref 20–29)
Chloride: 100 mmol/L (ref 96–106)
Creatinine, Ser: 1.08 mg/dL (ref 0.76–1.27)
GFR calc Af Amer: 95 mL/min/{1.73_m2} (ref >59)
GFR, EST NON AFRICAN AMERICAN: 82 mL/min/{1.73_m2} (ref >59)
GLOBULIN, TOTAL: 3 g/dL (ref 1.5–4.5)
Glucose: 98 mg/dL (ref 65–99)
Potassium: 4.2 mmol/L (ref 3.5–5.2)
SODIUM: 138 mmol/L (ref 134–144)
Total Protein: 6.9 g/dL (ref 6.0–8.5)

## 2018-03-05 LAB — LIPID PANEL
CHOLESTEROL TOTAL: 161 mg/dL (ref 100–199)
Chol/HDL Ratio: 4.9 ratio (ref 0.0–5.0)
HDL: 33 mg/dL — AB (ref >39)
LDL CALC: 112 mg/dL — AB (ref 0–99)
Triglycerides: 79 mg/dL (ref 0–149)
VLDL CHOLESTEROL CAL: 16 mg/dL (ref 5–40)

## 2018-03-05 LAB — CBC
HEMOGLOBIN: 14.1 g/dL (ref 13.0–17.7)
Hematocrit: 40 % (ref 37.5–51.0)
MCH: 31.1 pg (ref 26.6–33.0)
MCHC: 35.3 g/dL (ref 31.5–35.7)
MCV: 88 fL (ref 79–97)
PLATELETS: 284 10*3/uL (ref 150–450)
RBC: 4.54 x10E6/uL (ref 4.14–5.80)
RDW: 12.5 % (ref 12.3–15.4)
WBC: 4.1 10*3/uL (ref 3.4–10.8)

## 2018-03-05 LAB — HEMOGLOBIN A1C
Est. average glucose Bld gHb Est-mCnc: 128 mg/dL
Hgb A1c MFr Bld: 6.1 % — ABNORMAL HIGH (ref 4.8–5.6)

## 2018-03-05 LAB — HIV ANTIBODY (ROUTINE TESTING W REFLEX): HIV SCREEN 4TH GENERATION: NONREACTIVE

## 2018-03-05 NOTE — Progress Notes (Signed)
Here are your lab results:  You are HIV negative. Your liver and kidney function are normal. Your blood count is normal.  Your LDL, bad cholesterol, is 112. Ideally, this should be less than 70. Please increase your daily activity, avoid fried foods and increase fish intake. Your hba1c is 6.1, this is pretty good.  How do you feel on ozempic so far?   Happy holidays to you and your family!  Sincerely,    Alphonsa Brickle N. Baird [facility], MD

## 2018-03-08 ENCOUNTER — Ambulatory Visit: Payer: Self-pay | Admitting: Adult Health

## 2018-03-11 ENCOUNTER — Encounter: Payer: Self-pay | Admitting: Internal Medicine

## 2018-04-04 ENCOUNTER — Other Ambulatory Visit: Payer: Self-pay | Admitting: Internal Medicine

## 2018-04-08 ENCOUNTER — Ambulatory Visit: Payer: BLUE CROSS/BLUE SHIELD | Admitting: Internal Medicine

## 2018-04-08 ENCOUNTER — Encounter: Payer: Self-pay | Admitting: Internal Medicine

## 2018-04-08 VITALS — BP 140/88 | HR 55 | Temp 97.8°F | Ht 70.0 in | Wt 317.4 lb

## 2018-04-08 DIAGNOSIS — E119 Type 2 diabetes mellitus without complications: Secondary | ICD-10-CM | POA: Insufficient documentation

## 2018-04-08 DIAGNOSIS — E1169 Type 2 diabetes mellitus with other specified complication: Secondary | ICD-10-CM | POA: Diagnosis not present

## 2018-04-08 DIAGNOSIS — I10 Essential (primary) hypertension: Secondary | ICD-10-CM | POA: Diagnosis not present

## 2018-04-08 DIAGNOSIS — E669 Obesity, unspecified: Secondary | ICD-10-CM

## 2018-04-08 DIAGNOSIS — Z6841 Body Mass Index (BMI) 40.0 and over, adult: Secondary | ICD-10-CM

## 2018-04-08 DIAGNOSIS — E1159 Type 2 diabetes mellitus with other circulatory complications: Secondary | ICD-10-CM | POA: Insufficient documentation

## 2018-04-08 DIAGNOSIS — I152 Hypertension secondary to endocrine disorders: Secondary | ICD-10-CM | POA: Insufficient documentation

## 2018-04-08 MED ORDER — SEMAGLUTIDE(0.25 OR 0.5MG/DOS) 2 MG/1.5ML ~~LOC~~ SOPN: 0.5000 mg | PEN_INJECTOR | SUBCUTANEOUS | 1 refills | Status: DC

## 2018-04-08 NOTE — Progress Notes (Signed)
  Subjective:     Patient ID: Drew Daniels , male    DOB: Jul 28, 1970 , 48 y.o.   MRN: [id number]   Chief Complaint  Patient presents with  . Ozempic f/u    HPI  He is here today for f/u Ozempic. He has tolerated this medication without any problems. He admits that he did have some nausea when he went to 0.5mg  dosage.     Past Medical History:  Diagnosis Date  . Asthma   . Diabetes mellitus without complication ([place])   . Hypertension      Family History  Problem Relation Age of Onset  . Healthy Mother   . Lung cancer Father   . Liver cancer Father   . Diabetes Other      Current Outpatient Medications:  .  albuterol (PROAIR HFA) 108 (90 Base) MCG/ACT inhaler, Inhale 2 puffs into the lungs every 6 (six) hours as needed for wheezing or shortness of breath., Disp: , Rfl:  .  amLODipine (NORVASC) 5 MG tablet, Take 5 mg by mouth daily., Disp: , Rfl:  .  fexofenadine (ALLEGRA) 180 MG tablet, TAKE 1 TABLET BY MOUTH EVERY DAY, Disp: 30 tablet, Rfl: 5 .  Semaglutide,0.25 or 0.5MG /DOS, (OZEMPIC, 0.25 OR 0.5 MG/DOSE,) 2 MG/1.5ML SOPN, Inject 0.5 mg into the skin once a week., Disp: 1 pen, Rfl: 1 .  telmisartan-hydrochlorothiazide (MICARDIS HCT) 80-12.5 MG tablet, Take 1 tablet by mouth daily., Disp: , Rfl:  .  Vitamin D, Ergocalciferol, (DRISDOL) 1.25 MG (50000 UT) CAPS capsule, TAKE ONE CAPSULE BY MOUTH 2 TIMES EVERY WEEK ON TUES/FRID, Disp: 24 capsule, Rfl: 0   Allergies  Allergen Reactions  . Pseudophed-Chlophedianol-Gg Nausea And Vomiting     Review of Systems  Constitutional: Negative.   Respiratory: Negative.   Cardiovascular: Negative.   Genitourinary: Negative.   Neurological: Negative.   Psychiatric/Behavioral: Negative.      Today's Vitals   04/08/18 0924  BP: 140/88  Pulse: (!) 55  Temp: 97.8 F (36.6 C)  TempSrc: Oral  Weight: (!) 317 lb 6.4 oz (144 kg)  Height: 5\' 10"  (1.778 m)   Body mass index is 45.54 kg/m.   Objective:  Physical Exam Vitals  signs and nursing note reviewed.  Constitutional:      Appearance: Normal appearance. He is obese.  HENT:     Head: Normocephalic and atraumatic.  Cardiovascular:     Rate and Rhythm: Normal rate and regular rhythm.     Heart sounds: Normal heart sounds.  Pulmonary:     Effort: Pulmonary effort is normal.     Breath sounds: Normal breath sounds.  Skin:    General: Skin is warm.  Neurological:     General: No focal deficit present.     Mental Status: He is alert and oriented to person, place, and time.  Psychiatric:        Mood and Affect: Mood normal.         Assessment And Plan:     1. Obesity, diabetes, and hypertension syndrome (HCC)  BP is fairly controlled. He is advised of ideal bp reading, less than 130/80.  He is encouraged to avoid adding salt to his foods. He will continue with Ozempic 0.5mg  once weekly for now. He is again encouraged to exercise at least 30 minutes five days weekly. He will rto in 2-3 months for his next a1c reading.   [name], MD

## 2018-04-08 NOTE — Patient Instructions (Signed)
Diabetes Mellitus and Exercise Exercising regularly is important for your overall health, especially when you have diabetes (diabetes mellitus). Exercising is not only about losing weight. It has many other health benefits, such as increasing muscle strength and bone density and reducing body fat and stress. This leads to improved fitness, flexibility, and endurance, all of which result in better overall health. Exercise has additional benefits for people with diabetes, including:  Reducing appetite.  Helping to lower and control blood glucose.  Lowering blood pressure.  Helping to control amounts of fatty substances (lipids) in the blood, such as cholesterol and triglycerides.  Helping the body to respond better to insulin (improving insulin sensitivity).  Reducing how much insulin the body needs.  Decreasing the risk for heart disease by: ? Lowering cholesterol and triglyceride levels. ? Increasing the levels of good cholesterol. ? Lowering blood glucose levels. What is my activity plan? Your health care provider or certified diabetes educator can help you make a plan for the type and frequency of exercise (activity plan) that works for you. Make sure that you:  Do at least 150 minutes of moderate-intensity or vigorous-intensity exercise each week. This could be brisk walking, biking, or water aerobics. ? Do stretching and strength exercises, such as yoga or weightlifting, at least 2 times a week. ? Spread out your activity over at least 3 days of the week.  Get some form of physical activity every day. ? Do not go more than 2 days in a row without some kind of physical activity. ? Avoid being inactive for more than 30 minutes at a time. Take frequent breaks to walk or stretch.  Choose a type of exercise or activity that you enjoy, and set realistic goals.  Start slowly, and gradually increase the intensity of your exercise over time. What do I need to know about managing my  diabetes?   Check your blood glucose before and after exercising. ? If your blood glucose is 240 mg/dL (13.3 mmol/L) or higher before you exercise, check your urine for ketones. If you have ketones in your urine, do not exercise until your blood glucose returns to normal. ? If your blood glucose is 100 mg/dL (5.6 mmol/L) or lower, eat a snack containing 15-20 grams of carbohydrate. Check your blood glucose 15 minutes after the snack to make sure that your level is above 100 mg/dL (5.6 mmol/L) before you start your exercise.  Know the symptoms of low blood glucose (hypoglycemia) and how to treat it. Your risk for hypoglycemia increases during and after exercise. Common symptoms of hypoglycemia can include: ? Hunger. ? Anxiety. ? Sweating and feeling clammy. ? Confusion. ? Dizziness or feeling light-headed. ? Increased heart rate or palpitations. ? Blurry vision. ? Tingling or numbness around the mouth, lips, or tongue. ? Tremors or shakes. ? Irritability.  Keep a rapid-acting carbohydrate snack available before, during, and after exercise to help prevent or treat hypoglycemia.  Avoid injecting insulin into areas of the body that are going to be exercised. For example, avoid injecting insulin into: ? The arms, when playing tennis. ? The legs, when jogging.  Keep records of your exercise habits. Doing this can help you and your health care provider adjust your diabetes management plan as needed. Write down: ? Food that you eat before and after you exercise. ? Blood glucose levels before and after you exercise. ? The type and amount of exercise you have done. ? When your insulin is expected to peak, if you use   insulin. Avoid exercising at times when your insulin is peaking.  When you start a new exercise or activity, work with your health care provider to make sure the activity is safe for you, and to adjust your insulin, medicines, or food intake as needed.  Drink plenty of water while  you exercise to prevent dehydration or heat stroke. Drink enough fluid to keep your urine clear or pale yellow. Summary  Exercising regularly is important for your overall health, especially when you have diabetes (diabetes mellitus).  Exercising has many health benefits, such as increasing muscle strength and bone density and reducing body fat and stress.  Your health care provider or certified diabetes educator can help you make a plan for the type and frequency of exercise (activity plan) that works for you.  When you start a new exercise or activity, work with your health care provider to make sure the activity is safe for you, and to adjust your insulin, medicines, or food intake as needed. This information is not intended to replace advice given to you by your health care provider. Make sure you discuss any questions you have with your health care provider. Document Released: 05/27/2003 Document Revised: 09/14/2016 Document Reviewed: 08/16/2015 Elsevier Interactive Patient Education  2019 [employer] Inc[employer]  

## 2018-06-05 ENCOUNTER — Other Ambulatory Visit: Payer: Self-pay | Admitting: Internal Medicine

## 2018-07-05 ENCOUNTER — Other Ambulatory Visit: Payer: Self-pay | Admitting: Internal Medicine

## 2018-07-09 ENCOUNTER — Ambulatory Visit: Payer: BLUE CROSS/BLUE SHIELD | Admitting: Internal Medicine

## 2018-07-10 ENCOUNTER — Other Ambulatory Visit: Payer: Self-pay

## 2018-07-10 ENCOUNTER — Ambulatory Visit: Payer: BLUE CROSS/BLUE SHIELD | Admitting: Internal Medicine

## 2018-07-24 ENCOUNTER — Other Ambulatory Visit: Payer: Self-pay | Admitting: Internal Medicine

## 2018-07-24 ENCOUNTER — Other Ambulatory Visit: Payer: Self-pay

## 2018-07-24 ENCOUNTER — Encounter: Payer: Self-pay | Admitting: Internal Medicine

## 2018-07-24 ENCOUNTER — Ambulatory Visit: Payer: BLUE CROSS/BLUE SHIELD | Admitting: Internal Medicine

## 2018-07-24 VITALS — BP 126/84 | HR 71 | Temp 98.5°F | Ht 70.0 in | Wt 309.4 lb

## 2018-07-24 DIAGNOSIS — E1159 Type 2 diabetes mellitus with other circulatory complications: Secondary | ICD-10-CM | POA: Diagnosis not present

## 2018-07-24 DIAGNOSIS — E669 Obesity, unspecified: Secondary | ICD-10-CM | POA: Diagnosis not present

## 2018-07-24 DIAGNOSIS — E119 Type 2 diabetes mellitus without complications: Secondary | ICD-10-CM

## 2018-07-24 DIAGNOSIS — J301 Allergic rhinitis due to pollen: Secondary | ICD-10-CM

## 2018-07-24 DIAGNOSIS — E1169 Type 2 diabetes mellitus with other specified complication: Secondary | ICD-10-CM | POA: Diagnosis not present

## 2018-07-24 DIAGNOSIS — I10 Essential (primary) hypertension: Secondary | ICD-10-CM | POA: Diagnosis not present

## 2018-07-24 DIAGNOSIS — Z6841 Body Mass Index (BMI) 40.0 and over, adult: Secondary | ICD-10-CM

## 2018-07-24 MED ORDER — MONTELUKAST SODIUM 10 MG PO TABS: 10.0000 mg | ORAL_TABLET | Freq: Every day | ORAL | 2 refills | Status: DC

## 2018-07-24 NOTE — Patient Instructions (Signed)

## 2018-07-24 NOTE — Progress Notes (Signed)
Subjective:     Patient ID: Drew Daniels , male    DOB: [birth year] , 48 y.o.   MRN: [id number]   Chief Complaint  Patient presents with  . Hypertension  . Diabetes    HPI  Hypertension  This is a chronic problem. The current episode started more than 1 year ago. The problem has been gradually improving since onset. The problem is controlled. Pertinent negatives include no blurred vision, chest pain, palpitations or shortness of breath. Risk factors for coronary artery disease include diabetes mellitus, dyslipidemia, male gender, obesity and sedentary lifestyle.  Diabetes  He presents for his follow-up diabetic visit. He has type 2 diabetes mellitus. There are no hypoglycemic associated symptoms. Pertinent negatives for diabetes include no blurred vision and no chest pain. There are no hypoglycemic complications. He participates in exercise intermittently. An ACE inhibitor/angiotensin II receptor blocker is being taken. Eye exam is current.     Past Medical History:  Diagnosis Date  . Asthma   . Diabetes mellitus without complication ([place])   . Hypertension      Family History  Problem Relation Age of Onset  . Healthy Mother   . Lung cancer Father   . Liver cancer Father   . Diabetes Other      Current Outpatient Medications:  .  albuterol (PROAIR HFA) 108 (90 Base) MCG/ACT inhaler, Inhale 2 puffs into the lungs every 6 (six) hours as needed for wheezing or shortness of breath., Disp: , Rfl:  .  amLODipine (NORVASC) 5 MG tablet, TAKE 1 TABLET BY MOUTH EVERY DAY, Disp: 90 tablet, Rfl: 1 .  fexofenadine (ALLEGRA) 180 MG tablet, TAKE 1 TABLET BY MOUTH EVERY DAY, Disp: 30 tablet, Rfl: 5 .  OZEMPIC, 0.25 OR 0.5 MG/DOSE, 2 MG/1.5ML SOPN, INJECT 0.5 MG INTO THE SKIN ONCE A WEEK., Disp: 3 pen, Rfl: 1 .  telmisartan-hydrochlorothiazide (MICARDIS HCT) 80-12.5 MG tablet, Take 1 tablet by mouth daily., Disp: , Rfl:  .  Vitamin D, Ergocalciferol, (DRISDOL) 1.25 MG (50000 UT) CAPS  capsule, TAKE ONE CAPSULE BY MOUTH 2 TIMES EVERY WEEK ON TUES/FRID, Disp: 24 capsule, Rfl: 0 .  montelukast (SINGULAIR) 10 MG tablet, Take 1 tablet (10 mg total) by mouth daily., Disp: 30 tablet, Rfl: 2   Allergies  Allergen Reactions  . Pseudophed-Chlophedianol-Gg Nausea And Vomiting     Review of Systems  Constitutional: Negative.   Eyes: Negative for blurred vision.  Respiratory: Negative.  Negative for shortness of breath.   Cardiovascular: Negative.  Negative for chest pain and palpitations.  Gastrointestinal: Negative.   Neurological: Negative.   Psychiatric/Behavioral: Negative.      Today's Vitals   07/24/18 1145  BP: 126/84  Pulse: 71  Temp: 98.5 F (36.9 C)  TempSrc: Oral  Weight: (!) 309 lb 6.4 oz (140.3 kg)  Height: '5\' 10"'  (1.778 m)   Body mass index is 44.39 kg/m.   Objective:  Physical Exam Vitals signs and nursing note reviewed.  Constitutional:      Appearance: Normal appearance.  Cardiovascular:     Rate and Rhythm: Normal rate. Rhythm irregular.     Heart sounds: Normal heart sounds.  Pulmonary:     Effort: Pulmonary effort is normal.     Breath sounds: Normal breath sounds.  Skin:    General: Skin is warm.  Neurological:     General: No focal deficit present.     Mental Status: He is alert.  Psychiatric:        Mood and  Affect: Mood normal.         Assessment And Plan:     1. Obesity, diabetes, and hypertension syndrome ([place])  I will check labs as listed below for diabetes f/u.  His blood pressure is well controlled. He was congratulated on his 8 pound weight loss since his last visit in 3 months.   - Hemoglobin A1c - BMP8+EGFR  2. Seasonal allergic rhinitis due to pollen  He will continue with Allegra 166m daily. I will add montelukast 184mdaily to his current regimen. Pt advised he should notice an improvement in his symptoms in 2-3 weeks. Possible side effects were also discussed with the patient.   3. Class 3 severe obesity  due to excess calories with serious comorbidity and body mass index (BMI) of 40.0 to 44.9 in [place] [facility] [place] Again, he was congratulated on 8 pound weight loss. He is encouraged to strive for 10-15 pound weight loss at his next visit.   RoMaximino GreenlandMD    THE PATIENT IS ENCOURAGED TO PRACTICE SOCIAL DISTANCING DUE TO THE COVID-19 PANDEMIC.

## 2018-07-25 LAB — BMP8+EGFR
BUN/Creatinine Ratio: 19 (ref 9–20)
BUN: 18 mg/dL (ref 6–24)
CO2: 22 mmol/L (ref 20–29)
Calcium: 9.6 mg/dL (ref 8.7–10.2)
Chloride: 102 mmol/L (ref 96–106)
Creatinine, Ser: 0.97 mg/dL (ref 0.76–1.27)
GFR calc Af Amer: 109 mL/min/{1.73_m2} (ref >59)
GFR calc non Af Amer: 94 mL/min/{1.73_m2} (ref >59)
Glucose: 96 mg/dL (ref 65–99)
Potassium: 4.2 mmol/L (ref 3.5–5.2)
Sodium: 140 mmol/L (ref 134–144)

## 2018-07-25 LAB — HEMOGLOBIN A1C
Est. average glucose Bld gHb Est-mCnc: 120 mg/dL
Hgb A1c MFr Bld: 5.8 % — ABNORMAL HIGH (ref 4.8–5.6)

## 2018-07-26 ENCOUNTER — Other Ambulatory Visit: Payer: Self-pay | Admitting: Internal Medicine

## 2018-08-21 ENCOUNTER — Other Ambulatory Visit: Payer: Self-pay | Admitting: Internal Medicine

## 2018-10-18 ENCOUNTER — Other Ambulatory Visit: Payer: Self-pay | Admitting: Internal Medicine

## 2018-11-03 ENCOUNTER — Other Ambulatory Visit: Payer: Self-pay | Admitting: Internal Medicine

## 2018-11-26 ENCOUNTER — Ambulatory Visit: Payer: BLUE CROSS/BLUE SHIELD | Admitting: Internal Medicine

## 2018-12-11 ENCOUNTER — Other Ambulatory Visit: Payer: Self-pay

## 2018-12-11 ENCOUNTER — Ambulatory Visit (INDEPENDENT_AMBULATORY_CARE_PROVIDER_SITE_OTHER): Payer: BC Managed Care – PPO | Admitting: Internal Medicine

## 2018-12-11 ENCOUNTER — Encounter: Payer: Self-pay | Admitting: Internal Medicine

## 2018-12-11 VITALS — BP 122/82 | HR 66 | Temp 98.2°F | Ht 70.4 in | Wt 315.6 lb

## 2018-12-11 DIAGNOSIS — E1159 Type 2 diabetes mellitus with other circulatory complications: Secondary | ICD-10-CM | POA: Diagnosis not present

## 2018-12-11 DIAGNOSIS — M545 Low back pain, unspecified: Secondary | ICD-10-CM

## 2018-12-11 DIAGNOSIS — I10 Essential (primary) hypertension: Secondary | ICD-10-CM | POA: Diagnosis not present

## 2018-12-11 DIAGNOSIS — E1169 Type 2 diabetes mellitus with other specified complication: Secondary | ICD-10-CM | POA: Diagnosis not present

## 2018-12-11 DIAGNOSIS — Z6841 Body Mass Index (BMI) 40.0 and over, adult: Secondary | ICD-10-CM

## 2018-12-11 DIAGNOSIS — E669 Obesity, unspecified: Secondary | ICD-10-CM

## 2018-12-11 MED ORDER — CYCLOBENZAPRINE HCL 10 MG PO TABS: 10.0000 mg | ORAL_TABLET | Freq: Every evening | ORAL | 0 refills | Status: DC | PRN

## 2018-12-11 NOTE — Patient Instructions (Signed)
Acute Back Pain, Adult Acute back pain is sudden and usually short-lived. It is often caused by an injury to the muscles and tissues in the back. The injury may result from:  A muscle or ligament getting overstretched or torn (strained). Ligaments are tissues that connect bones to each other. Lifting something improperly can cause a back strain.  Wear and tear (degeneration) of the spinal disks. Spinal disks are circular tissue that provides cushioning between the bones of the spine (vertebrae).  Twisting motions, such as while playing sports or doing yard work.  A hit to the back.  Arthritis. You may have a physical exam, lab tests, and imaging tests to find the cause of your pain. Acute back pain usually goes away with rest and home care. Follow these instructions at home: Managing pain, stiffness, and swelling  Take over-the-counter and prescription medicines only as told by your health care provider.  Your health care provider may recommend applying ice during the first 24-48 hours after your pain starts. To do this: ? Put ice in a plastic bag. ? Place a towel between your skin and the bag. ? Leave the ice on for 20 minutes, 2-3 times a day.  If directed, apply heat to the affected area as often as told by your health care provider. Use the heat source that your health care provider recommends, such as a moist heat pack or a heating pad. ? Place a towel between your skin and the heat source. ? Leave the heat on for 20-30 minutes. ? Remove the heat if your skin turns bright red. This is especially important if you are unable to feel pain, heat, or cold. You have a greater risk of getting burned. Activity   Do not stay in bed. Staying in bed for more than 1-2 days can delay your recovery.  Sit up and stand up straight. Avoid leaning forward when you sit, or hunching over when you stand. ? If you work at a desk, sit close to it so you do not need to lean over. Keep your chin tucked  in. Keep your neck drawn back, and keep your elbows bent at a right angle. Your arms should look like the letter "L." ? Sit high and close to the steering wheel when you drive. Add lower back (lumbar) support to your car seat, if needed.  Take short walks on even surfaces as soon as you are able. Try to increase the length of time you walk each day.  Do not sit, drive, or stand in one place for more than 30 minutes at a time. Sitting or standing for long periods of time can put stress on your back.  Do not drive or use heavy machinery while taking prescription pain medicine.  Use proper lifting techniques. When you bend and lift, use positions that put less stress on your back: ? Bend your knees. ? Keep the load close to your body. ? Avoid twisting.  Exercise regularly as told by your health care provider. Exercising helps your back heal faster and helps prevent back injuries by keeping muscles strong and flexible.  Work with a physical therapist to make a safe exercise program, as recommended by your health care provider. Do any exercises as told by your physical therapist. Lifestyle  Maintain a healthy weight. Extra weight puts stress on your back and makes it difficult to have good posture.  Avoid activities or situations that make you feel anxious or stressed. Stress and anxiety increase muscle   tension and can make back pain worse. Learn ways to manage anxiety and stress, such as through exercise. General instructions  Sleep on a firm mattress in a comfortable position. Try lying on your side with your knees slightly bent. If you lie on your back, put a pillow under your knees.  Follow your treatment plan as told by your health care provider. This may include: ? Cognitive or behavioral therapy. ? Acupuncture or massage therapy. ? Meditation or yoga. Contact a health care provider if:  You have pain that is not relieved with rest or medicine.  You have increasing pain going down  into your legs or buttocks.  Your pain does not improve after 2 weeks.  You have pain at night.  You lose weight without trying.  You have a fever or chills. Get help right away if:  You develop new bowel or bladder control problems.  You have unusual weakness or numbness in your arms or legs.  You develop nausea or vomiting.  You develop abdominal pain.  You feel faint. Summary  Acute back pain is sudden and usually short-lived.  Use proper lifting techniques. When you bend and lift, use positions that put less stress on your back.  Take over-the-counter and prescription medicines and apply heat or ice as directed by your health care provider. This information is not intended to replace advice given to you by your health care provider. Make sure you discuss any questions you have with your health care provider. Document Released: 03/06/2005 Document Revised: 06/25/2018 Document Reviewed: 10/18/2016 Elsevier Patient Education  2020 [employer] Inc.  

## 2018-12-12 LAB — HEMOGLOBIN A1C
Est. average glucose Bld gHb Est-mCnc: 126 mg/dL
Hgb A1c MFr Bld: 6 % — ABNORMAL HIGH (ref 4.8–5.6)

## 2018-12-16 NOTE — Progress Notes (Signed)
Subjective:     Patient ID: Drew Daniels , male    DOB: 03-12-1971 , 48 y.o.   MRN: [id number]   Chief Complaint  Patient presents with  . Diabetes  . Back Pain    HPI  Diabetes He presents for his follow-up diabetic visit. He has type 2 diabetes mellitus. There are no hypoglycemic associated symptoms. There are no hypoglycemic complications. He participates in exercise intermittently. An ACE inhibitor/angiotensin II receptor blocker is being taken. Eye exam is current.  Back Pain     Past Medical History:  Diagnosis Date  . Asthma   . Diabetes mellitus without complication ([place])   . Hypertension      Family History  Problem Relation Age of Onset  . Healthy Mother   . Lung cancer Father   . Liver cancer Father   . Diabetes Other      Current Outpatient Medications:  .  albuterol (PROAIR HFA) 108 (90 Base) MCG/ACT inhaler, Inhale 2 puffs into the lungs every 6 (six) hours as needed for wheezing or shortness of breath., Disp: , Rfl:  .  amLODipine (NORVASC) 5 MG tablet, TAKE 1 TABLET BY MOUTH EVERY DAY, Disp: 90 tablet, Rfl: 1 .  fexofenadine (ALLEGRA) 180 MG tablet, TAKE 1 TABLET BY MOUTH EVERY DAY, Disp: 90 tablet, Rfl: 1 .  montelukast (SINGULAIR) 10 MG tablet, TAKE 1 TABLET BY MOUTH EVERY DAY, Disp: 90 tablet, Rfl: 0 .  OZEMPIC, 0.25 OR 0.5 MG/DOSE, 2 MG/1.5ML SOPN, INJECT 0.5 MG INTO THE SKIN ONCE A WEEK., Disp: 3 pen, Rfl: 1 .  telmisartan-hydrochlorothiazide (MICARDIS HCT) 80-25 MG tablet, TAKE 1 TABLET BY MOUTH DAILY, Disp: 90 tablet, Rfl: 1 .  Vitamin D, Ergocalciferol, (DRISDOL) 1.25 MG (50000 UT) CAPS capsule, TAKE ONE CAPSULE BY MOUTH 2 TIMES EVERY WEEK ON TUES/FRID, Disp: 24 capsule, Rfl: 0 .  cyclobenzaprine (FLEXERIL) 10 MG tablet, Take 1 tablet (10 mg total) by mouth at bedtime as needed for muscle spasms., Disp: 30 tablet, Rfl: 0 .  telmisartan-hydrochlorothiazide (MICARDIS HCT) 80-12.5 MG tablet, Take 1 tablet by mouth daily., Disp: , Rfl:    Allergies   Allergen Reactions  . Pseudophed-Chlophedianol-Gg Nausea And Vomiting     Review of Systems  Constitutional: Negative.   Respiratory: Negative.   Cardiovascular: Negative.   Gastrointestinal: Negative.   Musculoskeletal: Positive for back pain.  Neurological: Negative.   Psychiatric/Behavioral: Negative.      Today's Vitals   12/11/18 1635  BP: 122/82  Pulse: 66  Temp: 98.2 F (36.8 C)  TempSrc: Oral  SpO2: 96%  Weight: (!) 315 lb 9.6 oz (143.2 kg)  Height: 5' 10.4" (1.788 m)   Body mass index is 44.77 kg/m.   Objective:  Physical Exam Vitals signs and nursing note reviewed.  Constitutional:      Appearance: Normal appearance. He is obese.  Cardiovascular:     Rate and Rhythm: Normal rate and regular rhythm.     Heart sounds: Normal heart sounds.  Pulmonary:     Effort: Pulmonary effort is normal.     Breath sounds: Normal breath sounds.  Skin:    General: Skin is warm.  Neurological:     General: No focal deficit present.     Mental Status: He is alert.  Psychiatric:        Mood and Affect: Mood normal.         Assessment And Plan:     1. Obesity, diabetes, and hypertension syndrome ([place])  He was  advised of six pound weight gain. He is encouraged to incorporate at least 150 minutes per week and to avoid sugary beverages.   Regarding his diabetes, I will check an a1c today. His sugars have been well controlled.  His blood pressure is well controlled. He will continue with current meds. He will rto in six months for re-evaluation. - Hemoglobin A1c  2. Acute midline low back pain without sciatica  He was given rx cyclobenzaprine to take nightly as needed. He was also encouraged to take magnesium 400mg  nightly. He is encouraged to contact me next week to let me know how he is doing.   3. Class 3 severe obesity due to excess calories with serious comorbidity and body mass index (BMI) of 40.0 to 44.9 in adult [facility])  He is encouraged to strive for BMI less  than 37 to decrease cardiac risk.   [name], MD    THE PATIENT IS ENCOURAGED TO PRACTICE SOCIAL DISTANCING DUE TO THE COVID-19 PANDEMIC.

## 2018-12-25 LAB — HM DIABETES EYE EXAM

## 2018-12-30 ENCOUNTER — Encounter: Payer: Self-pay | Admitting: Internal Medicine

## 2019-01-11 ENCOUNTER — Other Ambulatory Visit: Payer: Self-pay | Admitting: Internal Medicine

## 2019-03-06 ENCOUNTER — Other Ambulatory Visit: Payer: Self-pay | Admitting: Internal Medicine

## 2019-03-12 ENCOUNTER — Encounter: Payer: Self-pay | Admitting: Internal Medicine

## 2019-03-12 ENCOUNTER — Other Ambulatory Visit: Payer: Self-pay

## 2019-03-12 ENCOUNTER — Ambulatory Visit (INDEPENDENT_AMBULATORY_CARE_PROVIDER_SITE_OTHER): Payer: BC Managed Care – PPO | Admitting: Internal Medicine

## 2019-03-12 VITALS — BP 138/80 | HR 66 | Temp 98.5°F | Ht 70.8 in | Wt 321.0 lb

## 2019-03-12 DIAGNOSIS — Z0000 Encounter for general adult medical examination without abnormal findings: Secondary | ICD-10-CM | POA: Diagnosis not present

## 2019-03-12 DIAGNOSIS — E7800 Pure hypercholesterolemia, unspecified: Secondary | ICD-10-CM

## 2019-03-12 DIAGNOSIS — Z6841 Body Mass Index (BMI) 40.0 and over, adult: Secondary | ICD-10-CM

## 2019-03-12 DIAGNOSIS — I10 Essential (primary) hypertension: Secondary | ICD-10-CM

## 2019-03-12 DIAGNOSIS — E1169 Type 2 diabetes mellitus with other specified complication: Secondary | ICD-10-CM | POA: Diagnosis not present

## 2019-03-12 DIAGNOSIS — Z79899 Other long term (current) drug therapy: Secondary | ICD-10-CM | POA: Diagnosis not present

## 2019-03-12 DIAGNOSIS — Z1211 Encounter for screening for malignant neoplasm of colon: Secondary | ICD-10-CM | POA: Diagnosis not present

## 2019-03-12 DIAGNOSIS — E1159 Type 2 diabetes mellitus with other circulatory complications: Secondary | ICD-10-CM | POA: Diagnosis not present

## 2019-03-12 DIAGNOSIS — E66813 Obesity, class 3: Secondary | ICD-10-CM

## 2019-03-12 DIAGNOSIS — E669 Obesity, unspecified: Secondary | ICD-10-CM

## 2019-03-12 LAB — POCT URINALYSIS DIPSTICK
Bilirubin, UA: NEGATIVE
Glucose, UA: NEGATIVE
Ketones, UA: NEGATIVE
Leukocytes, UA: NEGATIVE
Nitrite, UA: NEGATIVE
Protein, UA: POSITIVE — AB
Spec Grav, UA: 1.025 (ref 1.010–1.025)
Urobilinogen, UA: 0.2 E.U./dL
pH, UA: 7 (ref 5.0–8.0)

## 2019-03-12 LAB — POCT UA - MICROALBUMIN
Creatinine, POC: 200 mg/dL
Microalbumin Ur, POC: 80 mg/L

## 2019-03-12 LAB — POC HEMOCCULT BLD/STL (OFFICE/1-CARD/DIAGNOSTIC)
Card #1 Date: 12232020
Fecal Occult Blood, POC: NEGATIVE

## 2019-03-12 MED ORDER — OZEMPIC (0.25 OR 0.5 MG/DOSE) 2 MG/1.5ML ~~LOC~~ SOPN: 0.5000 mg | PEN_INJECTOR | SUBCUTANEOUS | 1 refills | Status: DC

## 2019-03-12 NOTE — Progress Notes (Signed)
This visit occurred during the SARS-CoV-2 public health emergency.  Safety protocols were in place, including screening questions prior to the visit, additional usage of staff PPE, and extensive cleaning of exam room while observing appropriate contact time as indicated for disinfecting solutions.  Subjective:     Patient ID: Drew Daniels , male    DOB: [birth year] , 48 y.o.   MRN: [id number]   Chief Complaint  Patient presents with  . Annual Exam  . Diabetes  . Hypertension    HPI  He is here today for a full physical examination. He has no specific concerns or complaints at this time. He admits he has not been taking Ozempic regularly.   Diabetes He presents for his follow-up diabetic visit. He has type 2 diabetes mellitus. There are no hypoglycemic associated symptoms. Pertinent negatives for diabetes include no blurred vision and no chest pain. There are no hypoglycemic complications. He participates in exercise intermittently. An ACE inhibitor/angiotensin II receptor blocker is being taken. Eye exam is current.  Hypertension This is a chronic problem. The current episode started more than 1 year ago. The problem has been gradually improving since onset. Pertinent negatives include no blurred vision, chest pain, palpitations or shortness of breath. Risk factors for coronary artery disease include diabetes mellitus, dyslipidemia, male gender, obesity and sedentary lifestyle. The current treatment provides moderate improvement. Compliance problems include exercise.      Past Medical History:  Diagnosis Date  . Asthma   . Diabetes mellitus without complication ([place])   . Hypertension      Family History  Problem Relation Age of Onset  . Healthy Mother   . Lung cancer Father   . Liver cancer Father   . Diabetes Other      Current Outpatient Medications:  .  albuterol (PROAIR HFA) 108 (90 Base) MCG/ACT inhaler, Inhale 2 puffs into the lungs every 6 (six) hours as needed for  wheezing or shortness of breath., Disp: , Rfl:  .  amLODipine (NORVASC) 5 MG tablet, TAKE 1 TABLET BY MOUTH EVERY DAY, Disp: 90 tablet, Rfl: 1 .  cyclobenzaprine (FLEXERIL) 10 MG tablet, Take 1 tablet (10 mg total) by mouth at bedtime as needed for muscle spasms., Disp: 30 tablet, Rfl: 0 .  fexofenadine (ALLEGRA) 180 MG tablet, TAKE 1 TABLET BY MOUTH EVERY DAY, Disp: 90 tablet, Rfl: 1 .  montelukast (SINGULAIR) 10 MG tablet, TAKE 1 TABLET BY MOUTH EVERY DAY, Disp: 90 tablet, Rfl: 0 .  telmisartan-hydrochlorothiazide (MICARDIS HCT) 80-25 MG tablet, TAKE 1 TABLET BY MOUTH EVERY DAY, Disp: 90 tablet, Rfl: 1 .  Vitamin D, Ergocalciferol, (DRISDOL) 1.25 MG (50000 UT) CAPS capsule, TAKE ONE CAPSULE BY MOUTH 2 TIMES EVERY WEEK ON TUES/FRID, Disp: 24 capsule, Rfl: 0 .  OZEMPIC, 0.25 OR 0.5 MG/DOSE, 2 MG/1.5ML SOPN, INJECT 0.5 MG INTO THE SKIN ONCE A WEEK. (Patient not taking: Reported on 03/12/2019), Disp: 3 pen, Rfl: 1   Allergies  Allergen Reactions  . Pseudophed-Chlophedianol-Gg Nausea And Vomiting     Review of Systems  Constitutional: Negative.   HENT: Negative.   Eyes: Negative.  Negative for blurred vision.  Respiratory: Negative.  Negative for shortness of breath.   Cardiovascular: Negative.  Negative for chest pain and palpitations.  Endocrine: Negative.   Genitourinary: Negative.   Skin: Negative.   Allergic/Immunologic: Negative.   Neurological: Negative.   Hematological: Negative.   Psychiatric/Behavioral: Negative.      Today's Vitals   03/12/19 0923  BP: 138/80  Pulse: 66  Temp: 98.5 F (36.9 C)  TempSrc: Oral  Weight: (!) 321 lb (145.6 kg)  Height: 5' 10.8" (1.798 m)   Body mass index is 45.02 kg/m.   Objective:  Physical Exam Vitals and nursing note reviewed.  Constitutional:      Appearance: Normal appearance. He is obese.  HENT:     Head: Normocephalic and atraumatic.     Right Ear: Tympanic membrane, ear canal and external ear normal.     Left Ear:  Tympanic membrane, ear canal and external ear normal.     Nose: Nose normal.     Mouth/Throat:     Mouth: Mucous membranes are moist.     Pharynx: Oropharynx is clear.  Eyes:     Extraocular Movements: Extraocular movements intact.     Conjunctiva/sclera: Conjunctivae normal.     Pupils: Pupils are equal, round, and reactive to light.  Cardiovascular:     Rate and Rhythm: Normal rate and regular rhythm.     Pulses: Normal pulses.          Dorsalis pedis pulses are 2+ on the right side and 2+ on the left side.     Heart sounds: Normal heart sounds.  Pulmonary:     Effort: Pulmonary effort is normal.     Breath sounds: Normal breath sounds.  Chest:     Breasts:        Right: Normal. No swelling, bleeding, inverted nipple, mass or nipple discharge.        Left: Normal. No swelling, bleeding, inverted nipple, mass or nipple discharge.  Abdominal:     General: Abdomen is flat. Bowel sounds are normal.     Palpations: Abdomen is soft.     Comments: Obese, soft. Difficult to assess organomegaly.   Genitourinary:    Prostate: Normal.     Rectum: Normal. Guaiac result negative.  Musculoskeletal:        General: Normal range of motion.     Cervical back: Normal range of motion and neck supple.  Feet:     Right foot:     Protective Sensation: 5 sites tested. 5 sites sensed.     Skin integrity: Callus and dry skin present.     Toenail Condition: Right toenails are long.     Left foot:     Protective Sensation: 5 sites tested. 5 sites sensed.     Skin integrity: Callus and dry skin present.     Toenail Condition: Left toenails are long.  Skin:    General: Skin is warm.  Neurological:     General: No focal deficit present.     Mental Status: He is alert.  Psychiatric:        Mood and Affect: Mood normal.        Behavior: Behavior normal.         Assessment And Plan:   1. Routine general medical examination at health care facility  A full exam was performed.  DRE performed,  stool heme negative. PATIENT HAS BEEN ADVISED TO GET 30-45 MINUTES REGULAR EXERCISE NO LESS THAN FOUR TO FIVE DAYS PER WEEK - BOTH WEIGHTBEARING EXERCISES AND AEROBIC ARE RECOMMENDED.  HE WAS ADVISED TO FOLLOW A HEALTHY DIET WITH AT LEAST SIX FRUITS/VEGGIES PER DAY, DECREASE INTAKE OF RED MEAT, AND TO INCREASE FISH INTAKE TO TWO DAYS PER WEEK.  MEATS/FISH SHOULD NOT BE FRIED, BAKED OR BROILED IS PREFERABLE.  I SUGGEST WEARING SPF 50 SUNSCREEN ON EXPOSED PARTS AND ESPECIALLY WHEN IN  THE DIRECT SUNLIGHT FOR AN EXTENDED PERIOD OF TIME.  PLEASE AVOID FAST FOOD RESTAURANTS AND INCREASE YOUR WATER INTAKE.  - CMP14+EGFR - CBC - Lipid panel - Hemoglobin A1c - PSA - POC Hemoccult Bld/Stl (1-Cd Office Dx)  2. Obesity, diabetes, and hypertension syndrome ([place])  Diabetic foot exam was performed. BP is fairly controlled. He will continue with current meds. Importance of weight loss was discussed with the patient.  EKG performed, junctional rhythm. He denies dizziness, dyspnea and chest pain at this time. He is encouraged to contact me ASAP if he develops any of these sx. I DISCUSSED WITH THE PATIENT AT LENGTH REGARDING THE GOALS OF GLYCEMIC CONTROL AND POSSIBLE LONG-TERM COMPLICATIONS.  HE IS ENCOURAGED TO COMPLY WITH HOME GLUCOSE MONITORING, DIETARY RESTRICTIONS INCLUDING AVOIDANCE OF SUGARY DRINKS/PROCESSED FOODS,  ALONG WITH REGULAR EXERCISE.  I  ALSO STRESSED THE IMPORTANCE OF ANNUAL EYE EXAMS, SELF FOOT CARE AND COMPLIANCE WITH OFFICE VISITS.  - POCT Urinalysis Dipstick (81002) - POCT UA - Microalbumin - EKG 12-Lead  3. Pure hypercholesterolemia  Chronic. I will check fasting lipid panel today. He is encouraged to avoid fried foods, increase daily activity and to cut back on his red meat intake. He is agreeable to start statin therapy as indicated.   4. Class 3 severe obesity due to excess calories with serious comorbidity and body mass index (BMI) of 45.0 to 49.9 in adult ([facility])  BMI 45. He is  encouraged to lose ten percent of his body weight to decrease cardiac risk. He is encouraged to exercise at least 30 minutes at least five days weekly.    [name], MD    THE PATIENT IS ENCOURAGED TO PRACTICE SOCIAL DISTANCING DUE TO THE COVID-19 PANDEMIC.

## 2019-03-12 NOTE — Patient Instructions (Signed)

## 2019-03-12 NOTE — Progress Notes (Signed)
poc

## 2019-03-13 LAB — CMP14+EGFR
ALT: 35 IU/L (ref 0–44)
AST: 29 IU/L (ref 0–40)
Albumin/Globulin Ratio: 1.4 (ref 1.2–2.2)
Albumin: 4.3 g/dL (ref 4.0–5.0)
Alkaline Phosphatase: 85 IU/L (ref 39–117)
BUN/Creatinine Ratio: 14 (ref 9–20)
BUN: 15 mg/dL (ref 6–24)
Bilirubin Total: 0.5 mg/dL (ref 0.0–1.2)
CO2: 24 mmol/L (ref 20–29)
Calcium: 9.5 mg/dL (ref 8.7–10.2)
Chloride: 102 mmol/L (ref 96–106)
Creatinine, Ser: 1.1 mg/dL (ref 0.76–1.27)
GFR calc Af Amer: 93 mL/min/{1.73_m2} (ref >59)
GFR calc non Af Amer: 80 mL/min/{1.73_m2} (ref >59)
Globulin, Total: 3 g/dL (ref 1.5–4.5)
Glucose: 87 mg/dL (ref 65–99)
Potassium: 4 mmol/L (ref 3.5–5.2)
Sodium: 139 mmol/L (ref 134–144)
Total Protein: 7.3 g/dL (ref 6.0–8.5)

## 2019-03-13 LAB — LIPID PANEL
Chol/HDL Ratio: 4.8 ratio (ref 0.0–5.0)
Cholesterol, Total: 176 mg/dL (ref 100–199)
HDL: 37 mg/dL — ABNORMAL LOW (ref >39)
LDL Chol Calc (NIH): 125 mg/dL — ABNORMAL HIGH (ref 0–99)
Triglycerides: 74 mg/dL (ref 0–149)
VLDL Cholesterol Cal: 14 mg/dL (ref 5–40)

## 2019-03-13 LAB — CBC
Hematocrit: 42.3 % (ref 37.5–51.0)
Hemoglobin: 15 g/dL (ref 13.0–17.7)
MCH: 31.3 pg (ref 26.6–33.0)
MCHC: 35.5 g/dL (ref 31.5–35.7)
MCV: 88 fL (ref 79–97)
Platelets: 264 10*3/uL (ref 150–450)
RBC: 4.79 x10E6/uL (ref 4.14–5.80)
RDW: 12.9 % (ref 11.6–15.4)
WBC: 4.7 10*3/uL (ref 3.4–10.8)

## 2019-03-13 LAB — PSA: Prostate Specific Ag, Serum: 0.5 ng/mL (ref 0.0–4.0)

## 2019-03-13 LAB — HEMOGLOBIN A1C
Est. average glucose Bld gHb Est-mCnc: 120 mg/dL
Hgb A1c MFr Bld: 5.8 % — ABNORMAL HIGH (ref 4.8–5.6)

## 2019-06-25 ENCOUNTER — Ambulatory Visit: Payer: BC Managed Care – PPO | Admitting: Internal Medicine

## 2019-11-20 ENCOUNTER — Encounter: Payer: Self-pay | Admitting: Internal Medicine

## 2019-12-11 ENCOUNTER — Ambulatory Visit: Payer: BC Managed Care – PPO | Admitting: Nurse Practitioner

## 2019-12-11 ENCOUNTER — Other Ambulatory Visit: Payer: Self-pay

## 2019-12-11 ENCOUNTER — Encounter: Payer: Self-pay | Admitting: Nurse Practitioner

## 2019-12-11 VITALS — BP 136/94 | HR 75 | Temp 98.3°F | Ht 70.8 in | Wt 339.4 lb

## 2019-12-11 DIAGNOSIS — R351 Nocturia: Secondary | ICD-10-CM

## 2019-12-11 DIAGNOSIS — N529 Male erectile dysfunction, unspecified: Secondary | ICD-10-CM

## 2019-12-11 DIAGNOSIS — Z1159 Encounter for screening for other viral diseases: Secondary | ICD-10-CM

## 2019-12-11 DIAGNOSIS — E7800 Pure hypercholesterolemia, unspecified: Secondary | ICD-10-CM

## 2019-12-11 DIAGNOSIS — I10 Essential (primary) hypertension: Secondary | ICD-10-CM | POA: Diagnosis not present

## 2019-12-11 DIAGNOSIS — L819 Disorder of pigmentation, unspecified: Secondary | ICD-10-CM

## 2019-12-11 DIAGNOSIS — R5383 Other fatigue: Secondary | ICD-10-CM

## 2019-12-11 DIAGNOSIS — L816 Other disorders of diminished melanin formation: Secondary | ICD-10-CM

## 2019-12-11 DIAGNOSIS — Z79899 Other long term (current) drug therapy: Secondary | ICD-10-CM | POA: Diagnosis not present

## 2019-12-11 NOTE — Progress Notes (Signed)
I,Katawbba Wiggins,acting as a Education administrator for [employer], FNP.,have documented all relevant documentation on the behalf of Minette Brine, FNP,as directed by  Minette Brine, FNP while in the presence of Minette Brine, [place].  This visit occurred during the SARS-CoV-2 public health emergency.  Safety protocols were in place, including screening questions prior to the visit, additional usage of staff PPE, and extensive cleaning of exam room while observing appropriate contact time as indicated for disinfecting solutions.  Subjective:     Patient ID: Drew Daniels , male    DOB: 10/31/1970 , 49 y.o.   MRN: [id number]   Chief Complaint  Patient presents with  . Erectile Dysfunction  . Rash    under stomach    HPI  The patient is here today for evaluation of erectile dysfunction and a rash. He has been going to the gym.  He feels as though he is gaining weight.  He has noticed lately when he bends over he is winded.  Yesterday when he went up stairs he was winded.  He is not working as much and cut back to two jobs.   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   He had his second vaccine in May 2021.    Erectile Dysfunction This is a new problem. The current episode started more than 1 month ago. The nature of his difficulty is achieving erection. He reports no decreased libido or performance anxiety. Nothing aggravates the symptoms. Past treatments include nothing. Risk factors include hypertension and no AM erections.  Rash     Past Medical History:  Diagnosis Date  . Asthma   . Diabetes mellitus without complication ([place])   . Hypertension      Family History  Problem Relation Age of Onset  . Healthy Mother   . Lung cancer Father   . Liver cancer Father   . Diabetes Other      Current Outpatient Medications:  .  albuterol (PROAIR HFA) 108 (90 Base) MCG/ACT inhaler, Inhale 2 puffs into the lungs  every 6 (six) hours as needed for wheezing or shortness of breath., Disp: , Rfl:  .  amLODipine (NORVASC) 5 MG tablet, TAKE 1 TABLET BY MOUTH EVERY DAY, Disp: 90 tablet, Rfl: 1 .  cyclobenzaprine (FLEXERIL) 10 MG tablet, Take 1 tablet (10 mg total) by mouth at bedtime as needed for muscle spasms., Disp: 30 tablet, Rfl: 0 .  telmisartan-hydrochlorothiazide (MICARDIS HCT) 80-25 MG tablet, TAKE 1 TABLET BY MOUTH EVERY DAY, Disp: 90 tablet, Rfl: 1 .  fexofenadine (ALLEGRA) 180 MG tablet, TAKE 1 TABLET BY MOUTH EVERY DAY (Patient not taking: Reported on 12/11/2019), Disp: 90 tablet, Rfl: 1 .  montelukast (SINGULAIR) 10 MG tablet, TAKE 1 TABLET BY MOUTH EVERY DAY (Patient not taking: Reported on 12/11/2019), Disp: 90 tablet, Rfl: 0 .  Semaglutide,0.25 or 0.5MG/DOS, (OZEMPIC, 0.25 OR 0.5 MG/DOSE,) 2 MG/1.5ML SOPN, Inject 0.5 mg into the skin once a week. (Patient not taking: Reported on 12/11/2019), Disp: 3 pen, Rfl: 1 .  Vitamin D, Ergocalciferol, (DRISDOL) 1.25 MG (50000 UT) CAPS capsule, TAKE ONE CAPSULE BY MOUTH 2 TIMES EVERY WEEK ON TUES/FRID (Patient not taking: Reported on 12/11/2019), Disp: 24 capsule, Rfl: 0   Allergies  Allergen Reactions  . Pseudophed-Chlophedianol-Gg Nausea And Vomiting     Review of Systems  Constitutional: Negative.   Respiratory: Negative.   Cardiovascular: Negative.  Negative for chest  pain, palpitations and leg swelling.  Gastrointestinal: Negative.   Genitourinary: Negative for decreased libido.  Skin: Positive for rash (under stomach and croch area).  Neurological: Negative for dizziness and headaches.  Psychiatric/Behavioral: Negative.   All other systems reviewed and are negative.    Today's Vitals   12/11/19 0952  BP: (!) 136/94  Pulse: 75  Temp: 98.3 F (36.8 C)  TempSrc: Oral  Weight: (!) 339 lb 6.4 oz (154 kg)  Height: 5' 10.8" (1.798 m)   Body mass index is 47.6 kg/m.   Objective:  Physical Exam Vitals and nursing note reviewed.   Constitutional:      Appearance: Normal appearance. He is obese.  HENT:     Head: Normocephalic and atraumatic.  Cardiovascular:     Rate and Rhythm: Normal rate and regular rhythm.     Pulses: Normal pulses.     Heart sounds: Normal heart sounds. No murmur heard.   Pulmonary:     Effort: Pulmonary effort is normal. No respiratory distress.     Breath sounds: Normal breath sounds.  Skin:    General: Skin is warm.     Capillary Refill: Capillary refill takes less than 2 seconds.  Neurological:     General: No focal deficit present.     Mental Status: He is alert and oriented to person, place, and time.     Cranial Nerves: No cranial nerve deficit.  Psychiatric:        Mood and Affect: Mood normal.        Behavior: Behavior normal.        Thought Content: Thought content normal.        Judgment: Judgment normal.         Assessment And Plan:     1. HTN (hypertension), malignant  Chronic, blood pressure is slightly elevated  He is encouraged to avoid high salt foods.  - Hemoglobin A1c - CMP14+EGFR  2. Nocturia  Will check PSA today  3. Erectile dysfunction, unspecified erectile dysfunction type  Will check PSA and other metabolic causes - PSA - Testosterone, Total - TSH  4. Fatigue, unspecified type  May be related to low vitamin b12 or thyroid problems - Vitamin B12 - TSH  5. Encounter for hepatitis C screening test for low risk patient  Will check Hepatitis C screening due to recent recommendations to screen all adults 18 years and older - Hepatitis C antibody  6. Pure hypercholesterolemia  Chronic, no current medications  Encouraged to avoid fried and fatty foods.   7. Skin hypopigmentation  He has hypopigmentation to his abdomen area, appears like may be vitiligo or an allergic reaction  Will refer to dermatology - Ambulatory referral to Dermatology     Patient was given opportunity to ask questions. Patient verbalized understanding of the  plan and was able to repeat key elements of the plan. All questions were answered to their satisfaction.   Teola Bradley, FNP, have reviewed all documentation for this visit. The documentation on 12/31/19 for the exam, diagnosis, procedures, and orders are all accurate and complete.  THE PATIENT IS ENCOURAGED TO PRACTICE SOCIAL DISTANCING DUE TO THE COVID-19 PANDEMIC.

## 2019-12-12 ENCOUNTER — Other Ambulatory Visit: Payer: Self-pay | Admitting: Nurse Practitioner

## 2019-12-12 DIAGNOSIS — R7989 Other specified abnormal findings of blood chemistry: Secondary | ICD-10-CM

## 2019-12-12 DIAGNOSIS — R5383 Other fatigue: Secondary | ICD-10-CM

## 2019-12-12 DIAGNOSIS — N529 Male erectile dysfunction, unspecified: Secondary | ICD-10-CM

## 2019-12-12 LAB — CMP14+EGFR
ALT: 21 IU/L (ref 0–44)
AST: 20 IU/L (ref 0–40)
Albumin/Globulin Ratio: 1.3 (ref 1.2–2.2)
Albumin: 4 g/dL (ref 4.0–5.0)
Alkaline Phosphatase: 101 IU/L (ref 44–121)
BUN/Creatinine Ratio: 13 (ref 9–20)
BUN: 15 mg/dL (ref 6–24)
Bilirubin Total: 0.3 mg/dL (ref 0.0–1.2)
CO2: 21 mmol/L (ref 20–29)
Calcium: 9.5 mg/dL (ref 8.7–10.2)
Chloride: 101 mmol/L (ref 96–106)
Creatinine, Ser: 1.13 mg/dL (ref 0.76–1.27)
GFR calc Af Amer: 89 mL/min/{1.73_m2} (ref >59)
GFR calc non Af Amer: 77 mL/min/{1.73_m2} (ref >59)
Globulin, Total: 3.2 g/dL (ref 1.5–4.5)
Glucose: 116 mg/dL — ABNORMAL HIGH (ref 65–99)
Potassium: 4.1 mmol/L (ref 3.5–5.2)
Sodium: 139 mmol/L (ref 134–144)
Total Protein: 7.2 g/dL (ref 6.0–8.5)

## 2019-12-12 LAB — TSH: TSH: 2.73 u[IU]/mL (ref 0.450–4.500)

## 2019-12-12 LAB — HEPATITIS C ANTIBODY: Hep C Virus Ab: 0.2 s/co ratio (ref 0.0–0.9)

## 2019-12-12 LAB — HEMOGLOBIN A1C
Est. average glucose Bld gHb Est-mCnc: 134 mg/dL
Hgb A1c MFr Bld: 6.3 % — ABNORMAL HIGH (ref 4.8–5.6)

## 2019-12-12 LAB — VITAMIN B12: Vitamin B-12: 910 pg/mL (ref 232–1245)

## 2019-12-12 LAB — TESTOSTERONE: Testosterone: 222 ng/dL — ABNORMAL LOW (ref 264–916)

## 2019-12-12 LAB — PSA: Prostate Specific Ag, Serum: 0.5 ng/mL (ref 0.0–4.0)

## 2019-12-14 ENCOUNTER — Encounter: Payer: Self-pay | Admitting: Nurse Practitioner

## 2019-12-16 LAB — TESTOSTERONE: Testosterone: 203 ng/dL — ABNORMAL LOW (ref 264–916)

## 2019-12-16 LAB — SPECIMEN STATUS REPORT

## 2019-12-18 DIAGNOSIS — R5383 Other fatigue: Secondary | ICD-10-CM | POA: Diagnosis not present

## 2019-12-18 DIAGNOSIS — R7989 Other specified abnormal findings of blood chemistry: Secondary | ICD-10-CM | POA: Diagnosis not present

## 2019-12-18 DIAGNOSIS — N529 Male erectile dysfunction, unspecified: Secondary | ICD-10-CM | POA: Diagnosis not present

## 2019-12-19 LAB — TESTOSTERONE: Testosterone: 154 ng/dL — ABNORMAL LOW (ref 264–916)

## 2019-12-22 ENCOUNTER — Other Ambulatory Visit: Payer: Self-pay

## 2019-12-22 DIAGNOSIS — N529 Male erectile dysfunction, unspecified: Secondary | ICD-10-CM

## 2019-12-22 DIAGNOSIS — R7989 Other specified abnormal findings of blood chemistry: Secondary | ICD-10-CM

## 2019-12-24 ENCOUNTER — Encounter: Payer: Self-pay | Admitting: Nurse Practitioner

## 2019-12-26 DIAGNOSIS — H5213 Myopia, bilateral: Secondary | ICD-10-CM | POA: Diagnosis not present

## 2019-12-26 DIAGNOSIS — H52222 Regular astigmatism, left eye: Secondary | ICD-10-CM | POA: Diagnosis not present

## 2019-12-26 DIAGNOSIS — E113393 Type 2 diabetes mellitus with moderate nonproliferative diabetic retinopathy without macular edema, bilateral: Secondary | ICD-10-CM | POA: Diagnosis not present

## 2019-12-31 LAB — HM DIABETES EYE EXAM

## 2020-01-05 ENCOUNTER — Encounter: Payer: Self-pay | Admitting: Internal Medicine

## 2020-01-07 ENCOUNTER — Encounter: Payer: Self-pay | Admitting: Nurse Practitioner

## 2020-01-07 ENCOUNTER — Ambulatory Visit: Payer: BC Managed Care – PPO | Admitting: Nurse Practitioner

## 2020-01-07 ENCOUNTER — Other Ambulatory Visit: Payer: Self-pay

## 2020-01-07 VITALS — BP 152/80 | HR 68 | Temp 98.2°F | Ht 70.8 in | Wt 338.6 lb

## 2020-01-07 DIAGNOSIS — E1169 Type 2 diabetes mellitus with other specified complication: Secondary | ICD-10-CM | POA: Diagnosis not present

## 2020-01-07 DIAGNOSIS — E669 Obesity, unspecified: Secondary | ICD-10-CM | POA: Diagnosis not present

## 2020-01-07 DIAGNOSIS — I10 Essential (primary) hypertension: Secondary | ICD-10-CM | POA: Diagnosis not present

## 2020-01-07 DIAGNOSIS — E1159 Type 2 diabetes mellitus with other circulatory complications: Secondary | ICD-10-CM | POA: Diagnosis not present

## 2020-01-07 DIAGNOSIS — I152 Hypertension secondary to endocrine disorders: Secondary | ICD-10-CM

## 2020-01-07 MED ORDER — AMLODIPINE BESYLATE 10 MG PO TABS: 10.0000 mg | ORAL_TABLET | Freq: Every day | ORAL | 1 refills | Status: DC

## 2020-01-07 NOTE — Patient Instructions (Addendum)

## 2020-01-07 NOTE — Progress Notes (Signed)
I,Yamilka [employer] as a Education administrator for [employer], FNP.,have documented all relevant documentation on the behalf of Minette Brine, FNP,as directed by  Minette Brine, FNP while in the presence of Minette Brine, [place]. This visit occurred during the SARS-CoV-2 public health emergency.  Safety protocols were in place, including screening questions prior to the visit, additional usage of staff PPE, and extensive cleaning of exam room while observing appropriate contact time as indicated for disinfecting solutions.  Subjective:     Patient ID: Drew Daniels , male    DOB: 1970-10-16 , 49 y.o.   MRN: [id number]   Chief Complaint  Patient presents with   Hypertension    HPI  Patient here for a blood pressure check.  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  Prior to that visit he had just eaten a chicken sandwich.  He does admit to eating fast food often at [employer] or [employer].  Drinks water but does not say how much.   Wt Readings from Last 3 Encounters: 01/07/20 : (!) 338 lb 9.6 oz (153.6 kg) 12/11/19 : (!) 339 lb 6.4 oz (154 kg) 03/12/19 : (!) 321 lb (145.6 kg)       Past Medical History:  Diagnosis Date   Asthma    Diabetes mellitus without complication ([place])    Hypertension      Family History  Problem Relation Age of Onset   Healthy Mother    Lung cancer Father    Liver cancer Father    Diabetes Other      Current Outpatient Medications:    albuterol (PROAIR HFA) 108 (90 Base) MCG/ACT inhaler, Inhale 2 puffs into the lungs every 6 (six) hours as needed for wheezing or shortness of breath., Disp: , Rfl:    amLODipine (NORVASC) 10 MG tablet, Take 1 tablet (10 mg total) by mouth daily., Disp: 90 tablet, Rfl: 1   cyclobenzaprine (FLEXERIL) 10 MG tablet, Take 1 tablet (10 mg total) by mouth at bedtime as needed for muscle spasms., Disp: 30 tablet, Rfl: 0   fexofenadine (ALLEGRA) 180 MG tablet, TAKE 1 TABLET BY MOUTH EVERY DAY (Patient  not taking: Reported on 12/11/2019), Disp: 90 tablet, Rfl: 1   montelukast (SINGULAIR) 10 MG tablet, TAKE 1 TABLET BY MOUTH EVERY DAY (Patient not taking: Reported on 12/11/2019), Disp: 90 tablet, Rfl: 0   Semaglutide,0.25 or 0.5MG /DOS, (OZEMPIC, 0.25 OR 0.5 MG/DOSE,) 2 MG/1.5ML SOPN, Inject 0.5 mg into the skin once a week. (Patient not taking: Reported on 12/11/2019), Disp: 3 pen, Rfl: 1   telmisartan-hydrochlorothiazide (MICARDIS HCT) 80-25 MG tablet, TAKE 1 TABLET BY MOUTH EVERY DAY (Patient not taking: Reported on 01/07/2020), Disp: 90 tablet, Rfl: 1   Vitamin D, Ergocalciferol, (DRISDOL) 1.25 MG (50000 UT) CAPS capsule, TAKE ONE CAPSULE BY MOUTH 2 TIMES EVERY WEEK ON TUES/FRID (Patient not taking: Reported on 12/11/2019), Disp: 24 capsule, Rfl: 0   Allergies  Allergen Reactions   Pseudophed-Chlophedianol-Gg Nausea And Vomiting     Review of Systems  Constitutional: Negative.   Respiratory: Negative.   Cardiovascular: Negative.  Negative for chest pain, palpitations and leg swelling.  Neurological: Negative for dizziness and headaches.  Psychiatric/Behavioral: Negative.      Today's Vitals   01/07/20 1206  BP: (!) 152/80  Pulse: 68  Temp: 98.2 F (36.8 C)  TempSrc: Oral  Weight: (!) 338 lb 9.6 oz (153.6 kg)  Height: 5' 10.8" (1.798 m)  PainSc: 0-No pain   Body mass  index is 47.49 kg/m.   Objective:  Physical Exam Vitals reviewed.  Constitutional:      General: He is not in acute distress.    Appearance: Normal appearance.  Cardiovascular:     Rate and Rhythm: Normal rate and regular rhythm.     Pulses: Normal pulses.     Heart sounds: Normal heart sounds. No murmur heard.   Pulmonary:     Effort: Pulmonary effort is normal. No respiratory distress.     Breath sounds: Normal breath sounds. No wheezing.  Neurological:     General: No focal deficit present.     Mental Status: He is alert and oriented to person, place, and time.     Cranial Nerves: No cranial nerve  deficit.  Psychiatric:        Mood and Affect: Mood normal.        Behavior: Behavior normal.        Thought Content: Thought content normal.        Judgment: Judgment normal.         Assessment And Plan:     1. HTN (hypertension), malignant  Blood pressure is elevated today, I will increase his amlodipine to 10mg  due to continues to have an elevation in his blood pressure over 140/90.  I will complete his form when he returns in December.   I have encouraged him to follow a low salt diet. - amLODipine (NORVASC) 10 MG tablet; Take 1 tablet (10 mg total) by mouth daily.  Dispense: 90 tablet; Refill: 1  2. Obesity, diabetes, and hypertension syndrome ([place])  I have discussed with him to increase his physical activity and will resend the ozempic to see if his insurance will cover.  This will help his diabetes and his weight.  I have also given him the phone numbers for the referrals he had placed to dermatology and Urology  Patient was given opportunity to ask questions. Patient verbalized understanding of the plan and was able to repeat key elements of the plan. All questions were answered to their satisfaction.    Teola Bradley, FNP, have reviewed all documentation for this visit. The documentation on 01/07/20 for the exam, diagnosis, procedures, and orders are all accurate and complete.  THE PATIENT IS ENCOURAGED TO PRACTICE SOCIAL DISTANCING DUE TO THE COVID-19 PANDEMIC.

## 2020-01-08 ENCOUNTER — Telehealth: Payer: Self-pay

## 2020-01-08 ENCOUNTER — Other Ambulatory Visit: Payer: Self-pay

## 2020-01-08 DIAGNOSIS — I10 Essential (primary) hypertension: Secondary | ICD-10-CM

## 2020-01-08 MED ORDER — AMLODIPINE BESYLATE 10 MG PO TABS: 10.0000 mg | ORAL_TABLET | Freq: Every day | ORAL | 1 refills | Status: DC

## 2020-01-08 NOTE — Telephone Encounter (Signed)
Refill faxed to [facility] for [facility]ine.

## 2020-02-18 ENCOUNTER — Telehealth: Payer: Self-pay

## 2020-02-18 DIAGNOSIS — R3915 Urgency of urination: Secondary | ICD-10-CM | POA: Diagnosis not present

## 2020-02-18 DIAGNOSIS — R350 Frequency of micturition: Secondary | ICD-10-CM | POA: Diagnosis not present

## 2020-02-18 DIAGNOSIS — E291 Testicular hypofunction: Secondary | ICD-10-CM | POA: Diagnosis not present

## 2020-02-18 DIAGNOSIS — N401 Enlarged prostate with lower urinary tract symptoms: Secondary | ICD-10-CM | POA: Diagnosis not present

## 2020-02-18 NOTE — Telephone Encounter (Signed)
The pt was notified that the physician statement form that he needed filled out has been completed by Laurance Flatten, DNP-BC and is ready for pickup.

## 2020-03-08 DIAGNOSIS — L80 Vitiligo: Secondary | ICD-10-CM | POA: Diagnosis not present

## 2020-03-08 DIAGNOSIS — L280 Lichen simplex chronicus: Secondary | ICD-10-CM | POA: Diagnosis not present

## 2020-03-18 ENCOUNTER — Other Ambulatory Visit: Payer: Self-pay

## 2020-03-18 ENCOUNTER — Encounter: Payer: Self-pay | Admitting: Nurse Practitioner

## 2020-03-18 ENCOUNTER — Ambulatory Visit: Payer: BC Managed Care – PPO | Admitting: Nurse Practitioner

## 2020-03-18 VITALS — BP 120/82 | HR 87 | Temp 98.8°F | Ht 70.8 in | Wt 345.0 lb

## 2020-03-18 DIAGNOSIS — Z1211 Encounter for screening for malignant neoplasm of colon: Secondary | ICD-10-CM

## 2020-03-18 DIAGNOSIS — Z0000 Encounter for general adult medical examination without abnormal findings: Secondary | ICD-10-CM | POA: Diagnosis not present

## 2020-03-18 DIAGNOSIS — R7309 Other abnormal glucose: Secondary | ICD-10-CM | POA: Diagnosis not present

## 2020-03-18 DIAGNOSIS — E7800 Pure hypercholesterolemia, unspecified: Secondary | ICD-10-CM | POA: Diagnosis not present

## 2020-03-18 DIAGNOSIS — I10 Essential (primary) hypertension: Secondary | ICD-10-CM | POA: Diagnosis not present

## 2020-03-18 DIAGNOSIS — Z6841 Body Mass Index (BMI) 40.0 and over, adult: Secondary | ICD-10-CM

## 2020-03-18 DIAGNOSIS — Z23 Encounter for immunization: Secondary | ICD-10-CM | POA: Diagnosis not present

## 2020-03-18 LAB — POCT URINALYSIS DIPSTICK
Bilirubin, UA: NEGATIVE
Blood, UA: NEGATIVE
Glucose, UA: NEGATIVE
Ketones, UA: NEGATIVE
Leukocytes, UA: NEGATIVE
Nitrite, UA: NEGATIVE
Protein, UA: NEGATIVE
Spec Grav, UA: 1.025 (ref 1.010–1.025)
Urobilinogen, UA: 0.2 E.U./dL
pH, UA: 6 (ref 5.0–8.0)

## 2020-03-18 LAB — POCT UA - MICROALBUMIN
Albumin/Creatinine Ratio, Urine, POC: <30
Creatinine, POC: 200 mg/dL
Microalbumin Ur, POC: 30 mg/L

## 2020-03-18 MED ORDER — OZEMPIC (0.25 OR 0.5 MG/DOSE) 2 MG/1.5ML ~~LOC~~ SOPN: 0.5000 mg | PEN_INJECTOR | SUBCUTANEOUS | 1 refills | Status: DC

## 2020-03-18 NOTE — Patient Instructions (Signed)

## 2020-03-18 NOTE — Progress Notes (Signed)
I,Yamilka [employer] as a Education administrator for [employer], FNP.,have documented all relevant documentation on the behalf of Minette Brine, FNP,as directed by  Minette Brine, FNP while in the presence of Minette Daniels, [place]. This visit occurred during the SARS-CoV-2 public health emergency.  Safety protocols were in place, including screening questions prior to the visit, additional usage of staff PPE, and extensive cleaning of exam room while observing appropriate contact time as indicated for disinfecting solutions.  Subjective:     Patient ID: Drew Daniels , male    DOB: 1971/01/27 , 49 y.o.   MRN: [id number]   Chief Complaint  Patient presents with  . Annual Exam    HPI  Here for hm.  Wt Readings from Last 3 Encounters: 03/18/20 : (!) 345 lb (156.5 kg)  01/07/20 : (!) 338 lb 9.6 oz (153.6 kg) 12/11/19 : (!) 339 lb 6.4 oz (154 kg)  He has been to the Urologist and his testosterone was low, recommended testosterone if he is not planning to have children. He is taking siladenifil.  He has taken a sleep study and said he did not have enough time to sleep.     Past Medical History:  Diagnosis Date  . Asthma   . Diabetes mellitus without complication ([place])   . Hypertension      Family History  Problem Relation Age of Onset  . Healthy Mother   . Lung cancer Father   . Liver cancer Father   . Diabetes Other      Current Outpatient Medications:  .  albuterol (VENTOLIN HFA) 108 (90 Base) MCG/ACT inhaler, Inhale 2 puffs into the lungs every 6 (six) hours as needed for wheezing or shortness of breath., Disp: , Rfl:  .  amLODipine (NORVASC) 10 MG tablet, Take 1 tablet (10 mg total) by mouth daily., Disp: 90 tablet, Rfl: 1 .  cyclobenzaprine (FLEXERIL) 10 MG tablet, Take 1 tablet (10 mg total) by mouth at bedtime as needed for muscle spasms., Disp: 30 tablet, Rfl: 0 .  fexofenadine (ALLEGRA) 180 MG tablet, TAKE 1 TABLET BY MOUTH EVERY DAY, Disp: 90 tablet, Rfl: 1 .  montelukast  (SINGULAIR) 10 MG tablet, TAKE 1 TABLET BY MOUTH EVERY DAY, Disp: 90 tablet, Rfl: 0 .  Semaglutide,0.25 or 0.5MG/DOS, (OZEMPIC, 0.25 OR 0.5 MG/DOSE,) 2 MG/1.5ML SOPN, Inject 0.5 mg into the skin once a week., Disp: 4.5 mL, Rfl: 1 .  telmisartan-hydrochlorothiazide (MICARDIS HCT) 80-25 MG tablet, TAKE 1 TABLET BY MOUTH EVERY DAY, Disp: 90 tablet, Rfl: 1 .  Vitamin D, Ergocalciferol, (DRISDOL) 1.25 MG (50000 UT) CAPS capsule, TAKE ONE CAPSULE BY MOUTH 2 TIMES EVERY WEEK ON TUES/FRID, Disp: 24 capsule, Rfl: 0   Allergies  Allergen Reactions  . Pseudophed-Chlophedianol-Gg Nausea And Vomiting     Men's preventive visit. Patient Health Questionnaire (PHQ-2) is  [place] Office Visit from 01/07/2020 in [facility]  PHQ-2 Total Score 0     Patient is on a Regular diet; limiting his bread intake, he is going to [place] - he has been eating more pancakes. He normally eats sweet potatoes and has had more white potatoes. He has been trying to get more rest but has not been exercising.  Marital status: Married. Relevant history for alcohol use is:  Social History   Substance and Sexual Activity  Alcohol Use No   Relevant history for tobacco use is:  Social History   Tobacco Use  Smoking Status Never Smoker  Smokeless Tobacco Never Used  .  Review of Systems  Constitutional: Negative.   HENT: Negative.   Eyes: Negative.   Respiratory: Negative.   Cardiovascular: Negative.   Gastrointestinal: Negative.   Endocrine: Negative.   Genitourinary: Negative.   Musculoskeletal: Negative.   Skin: Negative.   Neurological: Negative.   Hematological: Negative.   Psychiatric/Behavioral: Negative.      Today's Vitals   03/18/20 0911  BP: 120/82  Pulse: 87  Temp: 98.8 F (37.1 C)  TempSrc: Oral  Weight: (!) 345 lb (156.5 kg)  Height: 5' 10.8" (1.798 m)  PainSc: 0-No pain   Body mass index is 48.39 kg/m.   Objective:  Physical Exam Vitals reviewed.   Constitutional:      Appearance: Normal appearance. He is obese.  HENT:     Head: Normocephalic and atraumatic.     Right Ear: Tympanic membrane, ear canal and external ear normal. There is no impacted cerumen.     Left Ear: Tympanic membrane, ear canal and external ear normal. There is no impacted cerumen.  Cardiovascular:     Rate and Rhythm: Normal rate and regular rhythm.     Pulses: Normal pulses.     Heart sounds: Normal heart sounds. No murmur heard.   Pulmonary:     Effort: Pulmonary effort is normal. No respiratory distress.     Breath sounds: Normal breath sounds. No wheezing.  Abdominal:     General: Abdomen is flat. Bowel sounds are normal. There is no distension.     Palpations: Abdomen is soft.  Genitourinary:    Prostate: Normal.     Rectum: Guaiac result negative.  Musculoskeletal:        General: Normal range of motion.     Cervical back: Normal range of motion and neck supple.  Skin:    General: Skin is warm.     Capillary Refill: Capillary refill takes less than 2 seconds.  Neurological:     General: No focal deficit present.     Mental Status: He is alert and oriented to person, place, and time.  Psychiatric:        Mood and Affect: Mood normal.        Behavior: Behavior normal.        Thought Content: Thought content normal.        Judgment: Judgment normal.         Assessment And Plan:    1. Encounter for general adult medical examination w/o abnormal findings . Behavior modifications discussed and diet history reviewed.   . Pt will continue to exercise regularly and modify diet with low GI, plant based foods and decrease intake of processed foods.  . Recommend intake of daily multivitamin, Vitamin D, and calcium.  . Recommend colonoscopy for preventive screenings, as well as recommend immunizations that include influenza, TDAP  - CBC  2. Pure hypercholesterolemia  Chronic, stable   Will recheck lipid panel - Lipid panel - CMP14+EGFR  3.  HTN (hypertension), malignant  Chronic, blood pressure is well controlled  Continue with current medications.  EKG done with sinus rhythm nonspecific t abnormality - POCT Urinalysis Dipstick (81002) - POCT UA - Microalbumin - EKG 12-Lead - CMP14+EGFR  4. Class 3 severe obesity due to excess calories with serious comorbidity and body mass index (BMI) of 45.0 to 49.9 in adult [facility]) Chronic, I have discussed with him the importance of increasing his physical activity and eating a healthy diet  5. Abnormal glucose  Chronic will try him on ozempic as this may  help with his weight loss as well  Denies family history of thyroid cancer or personal history of pancreatitis  Injection education done - Hemoglobin A1c - Semaglutide,0.25 or 0.5MG/DOS, (OZEMPIC, 0.25 OR 0.5 MG/DOSE,) 2 MG/1.5ML SOPN; Inject 0.5 mg into the skin once a week.  Dispense: 4.5 mL; Refill: 1  6. Encounter for immunization - Pneumococcal polysaccharide vaccine 23-valent greater than or equal to 2yo subcutaneous/IM  7. Encounter for screening colonoscopy  According to USPTF Colorectal cancer Screening guidelines. Colonoscopy is recommended every 10 years, starting at age 27-49 years if approved by insurance.  Will refer to GI for colon cancer screening. - Ambulatory referral to Gastroenterology     Patient was given opportunity to ask questions. Patient verbalized understanding of the plan and was able to repeat key elements of the plan. All questions were answered to their satisfaction.   Minette Brine, FNP     I, Minette Brine, FNP, have reviewed all documentation for this visit. The documentation on 03/18/20 for the exam, diagnosis, procedures, and orders are all accurate and complete.   THE PATIENT IS ENCOURAGED TO PRACTICE SOCIAL DISTANCING DUE TO THE COVID-19 PANDEMIC.

## 2020-03-19 ENCOUNTER — Other Ambulatory Visit: Payer: Self-pay | Admitting: Internal Medicine

## 2020-03-19 LAB — CBC
Hematocrit: 44.7 % (ref 37.5–51.0)
Hemoglobin: 14.8 g/dL (ref 13.0–17.7)
MCH: 29.5 pg (ref 26.6–33.0)
MCHC: 33.1 g/dL (ref 31.5–35.7)
MCV: 89 fL (ref 79–97)
Platelets: 277 10*3/uL (ref 150–450)
RBC: 5.02 x10E6/uL (ref 4.14–5.80)
RDW: 12.8 % (ref 11.6–15.4)
WBC: 6.1 10*3/uL (ref 3.4–10.8)

## 2020-03-19 LAB — HEMOGLOBIN A1C
Est. average glucose Bld gHb Est-mCnc: 186 mg/dL
Hgb A1c MFr Bld: 8.1 % — ABNORMAL HIGH (ref 4.8–5.6)

## 2020-03-19 LAB — CMP14+EGFR
ALT: 32 IU/L (ref 0–44)
AST: 20 IU/L (ref 0–40)
Albumin/Globulin Ratio: 1.1 — ABNORMAL LOW (ref 1.2–2.2)
Albumin: 4.1 g/dL (ref 4.0–5.0)
Alkaline Phosphatase: 103 IU/L (ref 44–121)
BUN/Creatinine Ratio: 14 (ref 9–20)
BUN: 17 mg/dL (ref 6–24)
Bilirubin Total: 0.4 mg/dL (ref 0.0–1.2)
CO2: 26 mmol/L (ref 20–29)
Calcium: 9.5 mg/dL (ref 8.7–10.2)
Chloride: 99 mmol/L (ref 96–106)
Creatinine, Ser: 1.23 mg/dL (ref 0.76–1.27)
GFR calc Af Amer: 80 mL/min/{1.73_m2} (ref >59)
GFR calc non Af Amer: 69 mL/min/{1.73_m2} (ref >59)
Globulin, Total: 3.6 g/dL (ref 1.5–4.5)
Glucose: 211 mg/dL — ABNORMAL HIGH (ref 65–99)
Potassium: 4 mmol/L (ref 3.5–5.2)
Sodium: 137 mmol/L (ref 134–144)
Total Protein: 7.7 g/dL (ref 6.0–8.5)

## 2020-03-19 LAB — LIPID PANEL
Chol/HDL Ratio: 6.6 ratio — ABNORMAL HIGH (ref 0.0–5.0)
Cholesterol, Total: 206 mg/dL — ABNORMAL HIGH (ref 100–199)
HDL: 31 mg/dL — ABNORMAL LOW (ref >39)
LDL Chol Calc (NIH): 151 mg/dL — ABNORMAL HIGH (ref 0–99)
Triglycerides: 131 mg/dL (ref 0–149)
VLDL Cholesterol Cal: 24 mg/dL (ref 5–40)

## 2020-03-20 HISTORY — PX: COLONOSCOPY: SHX174

## 2020-03-22 ENCOUNTER — Encounter: Payer: Self-pay | Admitting: Internal Medicine

## 2020-03-26 DIAGNOSIS — E113393 Type 2 diabetes mellitus with moderate nonproliferative diabetic retinopathy without macular edema, bilateral: Secondary | ICD-10-CM | POA: Diagnosis not present

## 2020-03-30 LAB — HM DIABETES EYE EXAM

## 2020-04-02 DIAGNOSIS — N401 Enlarged prostate with lower urinary tract symptoms: Secondary | ICD-10-CM | POA: Diagnosis not present

## 2020-04-02 DIAGNOSIS — E291 Testicular hypofunction: Secondary | ICD-10-CM | POA: Diagnosis not present

## 2020-04-02 DIAGNOSIS — R350 Frequency of micturition: Secondary | ICD-10-CM | POA: Diagnosis not present

## 2020-04-02 DIAGNOSIS — N5201 Erectile dysfunction due to arterial insufficiency: Secondary | ICD-10-CM | POA: Diagnosis not present

## 2020-04-02 DIAGNOSIS — R3915 Urgency of urination: Secondary | ICD-10-CM | POA: Diagnosis not present

## 2020-04-07 ENCOUNTER — Encounter: Payer: Self-pay | Admitting: Internal Medicine

## 2020-04-09 ENCOUNTER — Encounter: Payer: Self-pay | Admitting: Internal Medicine

## 2020-04-09 ENCOUNTER — Telehealth: Payer: Self-pay

## 2020-05-07 ENCOUNTER — Other Ambulatory Visit: Payer: Self-pay

## 2020-05-07 ENCOUNTER — Telehealth: Payer: Self-pay

## 2020-05-07 ENCOUNTER — Ambulatory Visit (AMBULATORY_SURGERY_CENTER): Payer: Self-pay

## 2020-05-07 VITALS — Ht 70.5 in | Wt 350.0 lb

## 2020-05-07 DIAGNOSIS — Z1211 Encounter for screening for malignant neoplasm of colon: Secondary | ICD-10-CM

## 2020-05-07 MED ORDER — PLENVU 140 G PO SOLR: 1.0000 | ORAL | 0 refills | Status: DC

## 2020-05-07 NOTE — Progress Notes (Signed)
No egg or soy allergy known to patient  No issues with past sedation with any surgeries or procedures No intubation problems in the past  No FH of Malignant Hyperthermia No diet pills per patient No home 02 use per patient  No blood thinners per patient  Pt denies issues with constipation  No A fib or A flutter  EMMI video via [place] 19 guidelines implemented in PV today with Pt and RN  Pt is fully vaccinated for Covid x 2; Pt denies loose or missing teeth; Patient denies dentures, partials, dental implants, capped or bonded teeth; Coupon given to pt in PV today , Code to Pharmacy and  NO PA's for preps discussed with pt in PV today  Discussed with pt there will be an out-of-pocket cost for prep and that varies from $0 to 70 dollars  Due to the COVID-19 pandemic we are asking patients to follow certain guidelines.  Pt aware of COVID protocols and [facility] guidelines

## 2020-05-07 NOTE — Telephone Encounter (Signed)
error 

## 2020-05-14 ENCOUNTER — Encounter: Payer: Self-pay | Admitting: Internal Medicine

## 2020-05-21 ENCOUNTER — Other Ambulatory Visit: Payer: Self-pay

## 2020-05-21 ENCOUNTER — Encounter: Payer: Self-pay | Admitting: Internal Medicine

## 2020-05-21 ENCOUNTER — Ambulatory Visit (AMBULATORY_SURGERY_CENTER): Payer: BC Managed Care – PPO | Admitting: Internal Medicine

## 2020-05-21 VITALS — BP 158/97 | HR 52 | Temp 96.9°F | Resp 11 | Ht 70.8 in | Wt 350.0 lb

## 2020-05-21 DIAGNOSIS — K635 Polyp of colon: Secondary | ICD-10-CM

## 2020-05-21 DIAGNOSIS — Z1211 Encounter for screening for malignant neoplasm of colon: Secondary | ICD-10-CM

## 2020-05-21 DIAGNOSIS — D125 Benign neoplasm of sigmoid colon: Secondary | ICD-10-CM

## 2020-05-21 MED ORDER — SODIUM CHLORIDE 0.9 % IV SOLN: 500.0000 mL | Freq: Once | INTRAVENOUS | Status: DC

## 2020-05-21 NOTE — Progress Notes (Signed)
To PACU, VSS. Report to [email or web address] 

## 2020-05-21 NOTE — Op Note (Signed)
[place] Patient Name: Drew Daniels Procedure Date: 05/21/2020 9:49 AM MRN: [id number] Endoscopist: Jerene Bears , MD Age: 50 Referring MD:  Date of Birth: Sep 02, 1970 Gender: Male Account #: [id number] Procedure:                Colonoscopy Indications:              Screening for colorectal malignant neoplasm, This                            is the patient's first colonoscopy Medicines:                Monitored Anesthesia Care Procedure:                Pre-Anesthesia Assessment:                           - Prior to the procedure, a History and Physical                            was performed, and patient medications and                            allergies were reviewed. The patient's tolerance of                            previous anesthesia was also reviewed. The risks                            and benefits of the procedure and the sedation                            options and risks were discussed with the patient.                            All questions were answered, and informed consent                            was obtained. Prior Anticoagulants: The patient has                            taken no previous anticoagulant or antiplatelet                            agents. ASA Grade Assessment: III - A patient with                            severe systemic disease. After reviewing the risks                            and benefits, the patient was deemed in                            satisfactory condition to undergo the procedure.  After obtaining informed consent, the colonoscope                            was passed under direct vision. Throughout the                            procedure, the patient's blood pressure, pulse, and                            oxygen saturations were monitored continuously. The                            Olympus [id number]-HQ190 [id number]) Colonoscope was                            introduced through the anus and  advanced to the                            cecum, identified by appendiceal orifice and                            ileocecal valve. The colonoscopy was somewhat                            difficult due to significant looping. Successful                            completion of the procedure was aided by applying                            abdominal pressure. The patient tolerated the                            procedure well. The quality of the bowel                            preparation was excellent. Scope In: 9:57:15 AM Scope Out: 10:18:40 AM Scope Withdrawal Time: 0 hours 7 minutes 48 seconds  Total Procedure Duration: 0 hours 21 minutes 25 seconds  Findings:                 The digital rectal exam was normal.                           A 5 mm polyp was found in the sigmoid colon. The                            polyp was sessile. The polyp was removed with a                            cold snare. Resection and retrieval were complete.                           Multiple small-mouthed diverticula were found in  the sigmoid colon.                           The exam was otherwise without abnormality on                            direct and retroflexion views. Complications:            No immediate complications. Estimated Blood Loss:     Estimated blood loss was minimal. Impression:               - One 5 mm polyp in the sigmoid colon, removed with                            a cold snare. Resected and retrieved.                           - Diverticulosis in the sigmoid colon.                           - The examination was otherwise normal on direct                            and retroflexion views. Recommendation:           - Patient has a contact number available for                            emergencies. The signs and symptoms of potential                            delayed complications were discussed with the                            patient. Return to  normal activities tomorrow.                            Written discharge instructions were provided to the                            patient.                           - Resume previous diet.                           - Continue present medications.                           - Await pathology results.                           - Repeat colonoscopy is recommended. The                            colonoscopy date will be determined after pathology  results from today's exam become available for                            review. Jerene Bears, MD 05/21/2020 10:24:37 AM This report has been signed electronically.

## 2020-05-21 NOTE — Progress Notes (Signed)
VS by CW  Pt's states no medical or surgical changes since previsit or office visit.  

## 2020-05-21 NOTE — Patient Instructions (Signed)
Handout given:  Polyps, diverticulosis Resume previous diet Continue current medications Await pathology results  YOU HAD AN ENDOSCOPIC PROCEDURE TODAY AT THE [facility]:   Refer to the procedure report that was given to you for any specific questions about what was found during the examination.  If the procedure report does not answer your questions, please call your gastroenterologist to clarify.  If you requested that your care partner not be given the details of your procedure findings, then the procedure report has been included in a sealed envelope for you to review at your convenience later.  YOU SHOULD EXPECT: Some feelings of bloating in the abdomen. Passage of more gas than usual.  Walking can help get rid of the air that was put into your GI tract during the procedure and reduce the bloating. If you had a lower endoscopy (such as a colonoscopy or flexible sigmoidoscopy) you may notice spotting of blood in your stool or on the toilet paper. If you underwent a bowel prep for your procedure, you may not have a normal bowel movement for a few days.  Please Note:  You might notice some irritation and congestion in your nose or some drainage.  This is from the oxygen used during your procedure.  There is no need for concern and it should clear up in a day or so.  SYMPTOMS TO REPORT IMMEDIATELY:   Following lower endoscopy (colonoscopy or flexible sigmoidoscopy):  Excessive amounts of blood in the stool  Significant tenderness or worsening of abdominal pains  Swelling of the abdomen that is new, acute  Fever of 100F or higher  For urgent or emergent issues, a gastroenterologist can be reached at any hour by calling [phone]. Do not use MyChart messaging for urgent concerns.   DIET:  We do recommend a small meal at first, but then you may proceed to your regular diet.  Drink plenty of fluids but you should avoid alcoholic beverages for 24 hours.  ACTIVITY:  You should  plan to take it easy for the rest of today and you should NOT DRIVE or use heavy machinery until tomorrow (because of the sedation medicines used during the test).    FOLLOW UP: Our staff will call the number listed on your records 48-72 hours following your procedure to check on you and address any questions or concerns that you may have regarding the information given to you following your procedure. If we do not reach you, we will leave a message.  We will attempt to reach you two times.  During this call, we will ask if you have developed any symptoms of COVID 19. If you develop any symptoms (ie: fever, flu-like symptoms, shortness of breath, cough etc.) before then, please call [phone].  If you test positive for Covid 19 in the 2 weeks post procedure, please call and report this information to us.    If any biopsies were taken you will be contacted by phone or by letter within the next 1-3 weeks.  Please call us at [phone] if you have not heard about the biopsies in 3 weeks.   SIGNATURES/CONFIDENTIALITY: You and/or your care partner have signed paperwork which will be entered into your electronic medical record.  These signatures attest to the fact that that the information above on your After Visit Summary has been reviewed and is understood.  Full responsibility of the confidentiality of this discharge information lies with you and/or your care-partner. 

## 2020-05-25 ENCOUNTER — Telehealth: Payer: Self-pay

## 2020-05-25 NOTE — Telephone Encounter (Signed)
Attempted to reach patient for post-procedure f/u call. No answer. Left message that we will make another attempt to reach him again later today and for him to please not hesitate to call us if he has any questions/concerns regarding his care. 

## 2020-05-25 NOTE — Telephone Encounter (Signed)
  Follow up Call-  Call back number 05/21/2020  Post procedure Call Back phone  # [phone]  Permission to leave phone message Yes  Some recent data might be hidden     Patient questions:  Do you have a fever, pain , or abdominal swelling? No. Pain Score  0 *  Have you tolerated food without any problems? Yes.    Have you been able to return to your normal activities? Yes.    Do you have any questions about your discharge instructions: Diet   No. Medications  No. Follow up visit  No.  Do you have questions or concerns about your Care? No.  Actions: * If pain score is 4 or above: No action needed, pain <4.  1. Have you developed a fever since your procedure? no  2.   Have you had an respiratory symptoms (SOB or cough) since your procedure? no  3.   Have you tested positive for COVID 19 since your procedure no  4.   Have you had any family members/close contacts diagnosed with the COVID 19 since your procedure?  no   If yes to any of these questions please route to Joylene John, RN and Joella Prince, RN

## 2020-05-27 ENCOUNTER — Encounter: Payer: Self-pay | Admitting: Internal Medicine

## 2020-06-08 ENCOUNTER — Ambulatory Visit: Payer: BC Managed Care – PPO | Admitting: Internal Medicine

## 2020-06-09 ENCOUNTER — Other Ambulatory Visit: Payer: Self-pay

## 2020-06-09 ENCOUNTER — Ambulatory Visit: Payer: BC Managed Care – PPO | Admitting: Nurse Practitioner

## 2020-06-09 VITALS — BP 160/80 | HR 94 | Temp 98.7°F | Ht 70.2 in | Wt 346.2 lb

## 2020-06-09 DIAGNOSIS — R7309 Other abnormal glucose: Secondary | ICD-10-CM

## 2020-06-09 DIAGNOSIS — Z6841 Body Mass Index (BMI) 40.0 and over, adult: Secondary | ICD-10-CM

## 2020-06-09 DIAGNOSIS — E7800 Pure hypercholesterolemia, unspecified: Secondary | ICD-10-CM | POA: Diagnosis not present

## 2020-06-09 DIAGNOSIS — I10 Essential (primary) hypertension: Secondary | ICD-10-CM

## 2020-06-09 MED ORDER — MONTELUKAST SODIUM 10 MG PO TABS: 10.0000 mg | ORAL_TABLET | Freq: Every day | ORAL | 0 refills | Status: DC

## 2020-06-09 MED ORDER — FEXOFENADINE HCL 180 MG PO TABS: 180.0000 mg | ORAL_TABLET | Freq: Every day | ORAL | 1 refills | Status: AC

## 2020-06-09 MED ORDER — OZEMPIC (0.25 OR 0.5 MG/DOSE) 2 MG/1.5ML ~~LOC~~ SOPN: 0.2500 mg | PEN_INJECTOR | SUBCUTANEOUS | 1 refills | Status: DC

## 2020-06-09 NOTE — Progress Notes (Signed)
I,Tianna Badgett,acting as a Education administrator for [employer], NP.,have documented all relevant documentation on the behalf of [employer], NP,as directed by  Bary Castilla, NP while in the presence of Bary Castilla, NP.  This visit occurred during the SARS-CoV-2 public health emergency.  Safety protocols were in place, including screening questions prior to the visit, additional usage of staff PPE, and extensive cleaning of exam room while observing appropriate contact time as indicated for disinfecting solutions.  Subjective:     Patient ID: Drew Daniels , male    DOB: [birth year] , 50 y.o.   MRN: [id number]   Chief Complaint  Patient presents with  . Hypertension    HPI  Patient here for a blood pressure check. He admits that he is not always compliant with his medications. He does admit to eating fast food often at [employer].  He drives a school bus. Drinks water but does not say how much. Patient did not respond to last lab results. He is not ready to start cholesterol medicine right now. He wanted to restart ozempic.  Diet: not eating right  Exercise: Just walking.  BP Readings from Last 3 Encounters: 06/09/20 : (!) 160/80 05/21/20 : (!) 158/97 03/18/20 : 120/82   Wt Readings from Last 3 Encounters: 06/09/20 : (!) 346 lb 3.2 oz (157 kg) 05/21/20 : (!) 350 lb (158.8 kg) 05/07/20 : (!) 350 lb (158.8 kg)    Hypertension This is a chronic problem. The problem is uncontrolled. Pertinent negatives include no chest pain, headaches, palpitations or shortness of breath. Risk factors for coronary artery disease include diabetes mellitus, sedentary lifestyle, obesity and dyslipidemia.     Past Medical History:  Diagnosis Date  . Allergy    seasonal allergies  . Asthma    hx of - uses inhaler PRN  . Diabetes mellitus without complication ([place])    diet controlled- not on meds at this time (05/07/2020)  . Hypertension    on meds  . Sleep apnea    sleeps in recliner  due to orthopnea     Family History  Problem Relation Age of Onset  . Healthy Mother   . Lung cancer Father   . Liver cancer Father   . Diabetes Other   . Esophageal cancer Neg Hx   . Colon polyps Neg Hx   . Colon cancer Neg Hx   . Rectal cancer Neg Hx   . Stomach cancer Neg Hx      Current Outpatient Medications:  .  albuterol (VENTOLIN HFA) 108 (90 Base) MCG/ACT inhaler, Inhale 2 puffs into the lungs every 6 (six) hours as needed for wheezing or shortness of breath., Disp: , Rfl:  .  amLODipine (NORVASC) 10 MG tablet, Take 1 tablet (10 mg total) by mouth daily., Disp: 90 tablet, Rfl: 1 .  clobetasol cream (TEMOVATE) 0.05 %, Apply topically as needed., Disp: , Rfl:  .  clomiPHENE (CLOMID) 50 MG tablet, Take 25 mg by mouth daily., Disp: , Rfl:  .  cyclobenzaprine (FLEXERIL) 10 MG tablet, Take 1 tablet (10 mg total) by mouth at bedtime as needed for muscle spasms., Disp: 30 tablet, Rfl: 0 .  sildenafil (VIAGRA) 100 MG tablet, Take 100 mg by mouth daily., Disp: , Rfl:  .  tacrolimus (PROTOPIC) 0.1 % ointment, Apply topically as needed., Disp: , Rfl:  .  telmisartan-hydrochlorothiazide (MICARDIS HCT) 80-25 MG tablet, TAKE 1 TABLET BY MOUTH EVERY DAY, Disp: 90 tablet, Rfl: 1 .  fexofenadine (ALLEGRA) 180 MG tablet,  Take 1 tablet (180 mg total) by mouth daily., Disp: 90 tablet, Rfl: 1 .  montelukast (SINGULAIR) 10 MG tablet, Take 1 tablet (10 mg total) by mouth daily., Disp: 90 tablet, Rfl: 0 .  Semaglutide,0.25 or 0.5MG/DOS, (OZEMPIC, 0.25 OR 0.5 MG/DOSE,) 2 MG/1.5ML SOPN, Inject 0.25 mg into the skin once a week., Disp: 4.5 mL, Rfl: 1   Allergies  Allergen Reactions  . Pseudoephedrine Nausea And Vomiting  . Pseudophed-Chlophedianol-Gg Nausea And Vomiting     Review of Systems  Constitutional: Negative.  Negative for chills, fatigue and fever.  Respiratory: Negative.  Negative for chest tightness, shortness of breath and wheezing.   Cardiovascular: Negative.  Negative for chest  pain and palpitations.  Gastrointestinal: Negative.  Negative for constipation, diarrhea and nausea.  Endocrine: Negative for polydipsia, polyphagia and polyuria.  Musculoskeletal: Negative for arthralgias and back pain.  Neurological: Negative.  Negative for tremors, numbness and headaches.     Today's Vitals   06/09/20 1144  BP: (!) 160/80  Pulse: 94  Temp: 98.7 F (37.1 C)  TempSrc: Oral  Weight: (!) 346 lb 3.2 oz (157 kg)  Height: 5' 10.2" (1.783 m)   Body mass index is 49.39 kg/m.  Wt Readings from Last 3 Encounters:  06/09/20 (!) 346 lb 3.2 oz (157 kg)  05/21/20 (!) 350 lb (158.8 kg)  05/07/20 (!) 350 lb (158.8 kg)    Objective:  Physical Exam Constitutional:      Appearance: Normal appearance. He is obese.  HENT:     Head: Normocephalic and atraumatic.  Cardiovascular:     Rate and Rhythm: Normal rate and regular rhythm.     Pulses: Normal pulses.     Heart sounds: Normal heart sounds. No murmur heard.   Pulmonary:     Effort: Pulmonary effort is normal. No respiratory distress.     Breath sounds: Normal breath sounds. No wheezing.  Abdominal:     General: Bowel sounds are normal.  Skin:    General: Skin is warm.     Capillary Refill: Capillary refill takes less than 2 seconds.  Neurological:     Mental Status: He is alert and oriented to person, place, and time.         Assessment And Plan:     1. HTN (hypertension), malignant -Chronic, uncontrolled  -Refilled BP meds today  -Continue with meds.  -Educated patient about sodium free diet. Educated patient about the importance of being complaint with medication.  - BMP8+EGFR  2. Pure hypercholesterolemia -Chronic -Will check lipid panel today  -Patient declined statin as of right now and would like to see what his labs are. -Educated patient the importance of a healthy low fat diet. Increase intake of fish and fiber. Decrease intake of fatty foods and drinks.  - Lipid panel  3. Abnormal  glucose -Chronic, will give samples of ozempic as he has not been taking it. Educated patient about the importance of taking his diabetes medicine. -Denied family history of thyroid cancer or personal history of pancreatitis.  -Educated patient about a diabetic diet including sugar free foods and drinks.  - Hemoglobin A1c  4. Class 3 severe obesity due to excess calories with serious comorbidity and body mass index (BMI) of 45.0 to 49.9 in adult [facility]) -Chronic will restart him on ozempic and sent referral to weight management.  -Educated patient about the importance of eating a healthy diet and exercising for atleast 30-45 min. Daily.  - Amb Ref to Medical Weight Management  He is encouraged to initially strive for BMI less than 30 to decrease cardiac risk. He is advised to exercise no less than 150 minutes per week.   Patient was given opportunity to ask questions. Patient verbalized understanding of the plan and was able to repeat key elements of the plan. All questions were answered to their satisfaction. Silvanna Ohmer DNP   I, [employer]  have reviewed all documentation for this visit. The documentation on 06/09/2020 for the exam, diagnosis, procedures, and orders are all accurate and complete.    IF YOU HAVE BEEN REFERRED TO A SPECIALIST, IT MAY TAKE 1-2 WEEKS TO SCHEDULE/PROCESS THE REFERRAL. IF YOU HAVE NOT HEARD FROM US/SPECIALIST IN TWO WEEKS, PLEASE GIVE [place] A CALL AT [phone].   THE PATIENT IS ENCOURAGED TO PRACTICE SOCIAL DISTANCING DUE TO THE COVID-19 PANDEMIC.

## 2020-06-09 NOTE — Patient Instructions (Signed)

## 2020-06-10 LAB — BMP8+EGFR
BUN/Creatinine Ratio: 11 (ref 9–20)
BUN: 12 mg/dL (ref 6–24)
CO2: 23 mmol/L (ref 20–29)
Calcium: 9 mg/dL (ref 8.7–10.2)
Chloride: 104 mmol/L (ref 96–106)
Creatinine, Ser: 1.08 mg/dL (ref 0.76–1.27)
Glucose: 94 mg/dL (ref 65–99)
Potassium: 4.1 mmol/L (ref 3.5–5.2)
Sodium: 141 mmol/L (ref 134–144)
eGFR: 85 mL/min/{1.73_m2} (ref >59)

## 2020-06-10 LAB — LIPID PANEL
Chol/HDL Ratio: 5.8 ratio — ABNORMAL HIGH (ref 0.0–5.0)
Cholesterol, Total: 169 mg/dL (ref 100–199)
HDL: 29 mg/dL — ABNORMAL LOW (ref >39)
LDL Chol Calc (NIH): 124 mg/dL — ABNORMAL HIGH (ref 0–99)
Triglycerides: 84 mg/dL (ref 0–149)
VLDL Cholesterol Cal: 16 mg/dL (ref 5–40)

## 2020-06-10 LAB — HEMOGLOBIN A1C
Est. average glucose Bld gHb Est-mCnc: 174 mg/dL
Hgb A1c MFr Bld: 7.7 % — ABNORMAL HIGH (ref 4.8–5.6)

## 2020-06-11 ENCOUNTER — Encounter: Payer: Self-pay | Admitting: Internal Medicine

## 2020-06-16 ENCOUNTER — Ambulatory Visit: Payer: BC Managed Care – PPO | Admitting: Internal Medicine

## 2020-06-25 DIAGNOSIS — E291 Testicular hypofunction: Secondary | ICD-10-CM | POA: Diagnosis not present

## 2020-06-30 DIAGNOSIS — E291 Testicular hypofunction: Secondary | ICD-10-CM | POA: Diagnosis not present

## 2020-06-30 DIAGNOSIS — N401 Enlarged prostate with lower urinary tract symptoms: Secondary | ICD-10-CM | POA: Diagnosis not present

## 2020-06-30 DIAGNOSIS — R350 Frequency of micturition: Secondary | ICD-10-CM | POA: Diagnosis not present

## 2020-06-30 DIAGNOSIS — N5201 Erectile dysfunction due to arterial insufficiency: Secondary | ICD-10-CM | POA: Diagnosis not present

## 2020-06-30 DIAGNOSIS — L900 Lichen sclerosus et atrophicus: Secondary | ICD-10-CM | POA: Diagnosis not present

## 2020-07-15 ENCOUNTER — Telehealth: Payer: Self-pay | Admitting: Internal Medicine

## 2020-07-15 NOTE — Telephone Encounter (Signed)
Called patient to reschedule appt due to being closed on fridays left VM

## 2020-07-20 ENCOUNTER — Ambulatory Visit: Payer: BC Managed Care – PPO | Admitting: Dietician

## 2020-07-30 ENCOUNTER — Ambulatory Visit: Payer: BC Managed Care – PPO | Admitting: Nurse Practitioner

## 2020-08-17 ENCOUNTER — Ambulatory Visit: Payer: BC Managed Care – PPO | Admitting: Dietician

## 2020-08-23 ENCOUNTER — Telehealth: Payer: Self-pay

## 2020-08-23 NOTE — Telephone Encounter (Signed)
The pt stated that his allergy medications aren't holding him all day, the pt said he took a covid test and it was neg, the pt was told to take allegra in the day, with o-t-c nasal spray like nasacort and his singular at night, the pt was told to discuss at his next visit if that doesn't helpt.

## 2020-08-26 ENCOUNTER — Ambulatory Visit: Payer: BC Managed Care – PPO | Admitting: Nurse Practitioner

## 2020-08-26 ENCOUNTER — Other Ambulatory Visit: Payer: Self-pay

## 2020-08-26 VITALS — BP 168/82 | HR 63 | Temp 98.2°F | Ht 69.4 in | Wt 339.0 lb

## 2020-08-26 DIAGNOSIS — E1165 Type 2 diabetes mellitus with hyperglycemia: Secondary | ICD-10-CM | POA: Diagnosis not present

## 2020-08-26 DIAGNOSIS — I10 Essential (primary) hypertension: Secondary | ICD-10-CM

## 2020-08-26 DIAGNOSIS — Z6841 Body Mass Index (BMI) 40.0 and over, adult: Secondary | ICD-10-CM

## 2020-08-26 MED ORDER — HYDRALAZINE HCL 25 MG PO TABS: 25.0000 mg | ORAL_TABLET | Freq: Two times a day (BID) | ORAL | 3 refills | Status: DC

## 2020-08-26 NOTE — Patient Instructions (Signed)

## 2020-08-26 NOTE — Progress Notes (Signed)
I,Tianna Badgett,acting as a Education administrator for [employer], NP.,have documented all relevant documentation on the behalf of [employer], NP,as directed by  Bary Castilla, NP while in the presence of Bary Castilla, NP.  This visit occurred during the SARS-CoV-2 public health emergency.  Safety protocols were in place, including screening questions prior to the visit, additional usage of staff PPE, and extensive cleaning of exam room while observing appropriate contact time as indicated for disinfecting solutions.  Subjective:     Patient ID: Drew Daniels , male    DOB: [birth year] , 50 y.o.   MRN: [id number]   Chief Complaint  Patient presents with   Hypertension   Diabetes    HPI  Patient here for a blood pressure check. He is taking all of BP meds. He has no concerns at this time. He is also trying to watch his diet and salt intake. He has lost 7 pounds since march. He denies HA, chest pain, palpitation or SOB.   BP Readings from Last 3 Encounters: 08/26/20 : (!) 168/82 06/09/20 : (!) 160/80 05/21/20 : (!) 158/97  Wt Readings from Last 3 Encounters: 08/26/20 : (!) 339 lb (153.8 kg) 06/09/20 : (!) 346 lb 3.2 oz (157 kg) 05/21/20 : (!) 350 lb (158.8 kg)    Hypertension This is a chronic problem. The problem is uncontrolled. Pertinent negatives include no chest pain, headaches, palpitations or shortness of breath. Risk factors for coronary artery disease include diabetes mellitus, sedentary lifestyle, obesity and dyslipidemia.    Past Medical History:  Diagnosis Date   Allergy    seasonal allergies   Asthma    hx of - uses inhaler PRN   Diabetes mellitus without complication ([place])    diet controlled- not on meds at this time (05/07/2020)   Hypertension    on meds   Sleep apnea    sleeps in recliner due to orthopnea     Family History  Problem Relation Age of Onset   Healthy Mother    Lung cancer Father    Liver cancer Father    Diabetes Other     Esophageal cancer Neg Hx    Colon polyps Neg Hx    Colon cancer Neg Hx    Rectal cancer Neg Hx    Stomach cancer Neg Hx      Current Outpatient Medications:    hydrALAZINE (APRESOLINE) 25 MG tablet, Take 1 tablet (25 mg total) by mouth 2 (two) times daily., Disp: 180 tablet, Rfl: 3   albuterol (VENTOLIN HFA) 108 (90 Base) MCG/ACT inhaler, Inhale 2 puffs into the lungs every 6 (six) hours as needed for wheezing or shortness of breath., Disp: , Rfl:    amLODipine (NORVASC) 10 MG tablet, Take 1 tablet (10 mg total) by mouth daily., Disp: 90 tablet, Rfl: 1   clobetasol cream (TEMOVATE) 0.05 %, Apply topically as needed., Disp: , Rfl:    clomiPHENE (CLOMID) 50 MG tablet, Take 25 mg by mouth daily., Disp: , Rfl:    clomiPRAMINE (ANAFRANIL) 25 MG capsule, Take 25 mg by mouth daily., Disp: , Rfl:    cyclobenzaprine (FLEXERIL) 10 MG tablet, Take 1 tablet (10 mg total) by mouth at bedtime as needed for muscle spasms., Disp: 30 tablet, Rfl: 0   fexofenadine (ALLEGRA) 180 MG tablet, Take 1 tablet (180 mg total) by mouth daily., Disp: 90 tablet, Rfl: 1   montelukast (SINGULAIR) 10 MG tablet, Take 1 tablet (10 mg total) by mouth daily., Disp: 90 tablet, Rfl:  0   Semaglutide,0.25 or 0.5MG /DOS, (OZEMPIC, 0.25 OR 0.5 MG/DOSE,) 2 MG/1.5ML SOPN, Inject 0.25 mg into the skin once a week., Disp: 4.5 mL, Rfl: 1   sildenafil (VIAGRA) 100 MG tablet, Take 100 mg by mouth daily., Disp: , Rfl:    tacrolimus (PROTOPIC) 0.1 % ointment, Apply topically as needed., Disp: , Rfl:    telmisartan-hydrochlorothiazide (MICARDIS HCT) 80-25 MG tablet, TAKE 1 TABLET BY MOUTH EVERY DAY, Disp: 90 tablet, Rfl: 1   Allergies  Allergen Reactions   Pseudoephedrine Nausea And Vomiting   Pseudophed-Chlophedianol-Gg Nausea And Vomiting        Today's Vitals   08/26/20 0945  BP: (!) 168/82  Pulse: 63  Temp: 98.2 F (36.8 C)  TempSrc: Oral  Weight: (!) 339 lb (153.8 kg)  Height: 5' 9.4" (1.763 m)   Body mass index is 49.49  kg/m.  Wt Readings from Last 3 Encounters:  08/26/20 (!) 339 lb (153.8 kg)  06/09/20 (!) 346 lb 3.2 oz (157 kg)  05/21/20 (!) 350 lb (158.8 kg)    Objective:  Physical Exam Constitutional:      Appearance: Normal appearance. He is obese.  HENT:     Head: Normocephalic and atraumatic.  Cardiovascular:     Rate and Rhythm: Normal rate and regular rhythm.     Pulses: Normal pulses.     Heart sounds: Normal heart sounds. No murmur heard. Pulmonary:     Effort: Pulmonary effort is normal. No respiratory distress.     Breath sounds: Normal breath sounds. No wheezing.  Skin:    General: Skin is warm and dry.     Capillary Refill: Capillary refill takes less than 2 seconds.  Neurological:     General: No focal deficit present.     Mental Status: He is alert and oriented to person, place, and time.        Assessment And Plan:     1. HTN (hypertension), malignant -Limit the intake of processed foods and salt intake. You should increase your intake of green vegetables and fruits. Limit the use of alcohol. Limit fast foods and fried foods. Avoid high fatty saturated and trans fat foods. Keep yourself hydrated with drinking water. Avoid red meats. Eat lean meats instead. Exercise for atleast 30-45 min for atleast 4-5 times a week.  -His in office BP was 168/82.  -Will add hydralazine twice daily  -Have advised pt to keep log of his blood pressure and msg if it is still elevated.  - hydrALAZINE (APRESOLINE) 25 MG tablet; Take 1 tablet (25 mg total) by mouth 2 (two) times daily.  Dispense: 180 tablet; Refill: 3 - CMP14+EGFR - CBC no Diff  2. Uncontrolled type 2 diabetes mellitus with hyperglycemia ([place]) -Continue ozempic, tolerating well with some Nausea at times -He has lost 7 pds since March. Will check Hgb A1c today.  -Patient would like to stay on 0.5 and will reassess in 3 months.  - Hemoglobin A1c  3. Class 3 severe obesity due to excess calories with serious comorbidity and  body mass index (BMI) of 45.0 to 49.9 in adult [facility])  Advised patient on a healthy diet including avoiding fast food and red meats. Increase the intake of lean meats including grilled chicken and [place].  Drink a lot of water. Decrease intake of fatty foods. Exercise for 30-45 min. 4-5 a week to decrease the risk of cardiac event.  -Taking ozempic which has helped with his weight loss -Pt. Will continue to exercise and focus  on his diet.   The patient was encouraged to call or send a message through [place] for any questions or concerns.   Side effects and appropriate use of all the medication(s) were discussed with the patient today. Patient advised to use the medication(s) as directed by their healthcare provider. The patient was encouraged to read, review, and understand all associated package inserts and contact our office with any questions or concerns. The patient accepts the risks of the treatment plan and had an opportunity to ask questions.   Follow up: if symptoms persist or do not get better.    Patient was given opportunity to ask questions. Patient verbalized understanding of the plan and was able to repeat key elements of the plan. All questions were answered to their satisfaction.  Raman Travone Georg, DNP   I, Raman Dniyah Grant have reviewed all documentation for this visit. The documentation on 08/26/20 for the exam, diagnosis, procedures, and orders are all accurate and complete.   IF YOU HAVE BEEN REFERRED TO A SPECIALIST, IT MAY TAKE 1-2 WEEKS TO SCHEDULE/PROCESS THE REFERRAL. IF YOU HAVE NOT HEARD FROM US/SPECIALIST IN TWO WEEKS, PLEASE GIVE [place] A CALL AT [phone].   THE PATIENT IS ENCOURAGED TO PRACTICE SOCIAL DISTANCING DUE TO THE COVID-19 PANDEMIC.

## 2020-08-27 LAB — CMP14+EGFR
ALT: 26 IU/L (ref 0–44)
AST: 18 IU/L (ref 0–40)
Albumin/Globulin Ratio: 1.3 (ref 1.2–2.2)
Albumin: 4.2 g/dL (ref 4.0–5.0)
Alkaline Phosphatase: 100 IU/L (ref 44–121)
BUN/Creatinine Ratio: 10 (ref 9–20)
BUN: 12 mg/dL (ref 6–24)
Bilirubin Total: 0.4 mg/dL (ref 0.0–1.2)
CO2: 23 mmol/L (ref 20–29)
Calcium: 9.2 mg/dL (ref 8.7–10.2)
Chloride: 102 mmol/L (ref 96–106)
Creatinine, Ser: 1.17 mg/dL (ref 0.76–1.27)
Globulin, Total: 3.3 g/dL (ref 1.5–4.5)
Glucose: 105 mg/dL — ABNORMAL HIGH (ref 65–99)
Potassium: 4.2 mmol/L (ref 3.5–5.2)
Sodium: 141 mmol/L (ref 134–144)
Total Protein: 7.5 g/dL (ref 6.0–8.5)
eGFR: 77 mL/min/{1.73_m2} (ref >59)

## 2020-08-27 LAB — CBC
Hematocrit: 45 % (ref 37.5–51.0)
Hemoglobin: 15.2 g/dL (ref 13.0–17.7)
MCH: 30 pg (ref 26.6–33.0)
MCHC: 33.8 g/dL (ref 31.5–35.7)
MCV: 89 fL (ref 79–97)
Platelets: 290 10*3/uL (ref 150–450)
RBC: 5.06 x10E6/uL (ref 4.14–5.80)
RDW: 13.1 % (ref 11.6–15.4)
WBC: 5 10*3/uL (ref 3.4–10.8)

## 2020-08-27 LAB — HEMOGLOBIN A1C
Est. average glucose Bld gHb Est-mCnc: 143 mg/dL
Hgb A1c MFr Bld: 6.6 % — ABNORMAL HIGH (ref 4.8–5.6)

## 2020-11-29 ENCOUNTER — Ambulatory Visit: Payer: BC Managed Care – PPO | Admitting: Internal Medicine

## 2020-11-29 ENCOUNTER — Encounter: Payer: BC Managed Care – PPO | Admitting: Internal Medicine

## 2020-11-29 NOTE — Progress Notes (Deleted)
I,Cane Dubray [employer] as a Education administrator for [name], MD.,have documented all relevant documentation on the behalf of [name], MD,as directed by  [name], MD while in the presence of [name], MD.  This visit occurred during the SARS-CoV-2 public health emergency.  Safety protocols were in place, including screening questions prior to the visit, additional usage of staff PPE, and extensive cleaning of exam room while observing appropriate contact time as indicated for disinfecting solutions.  Subjective:     Patient ID: Drew Daniels , male    DOB: 25-Nov-1970 , 50 y.o.   MRN: [id number]   No chief complaint on file.   HPI  Patient here for a blood pressure and diabetes check.     Hypertension This is a chronic problem. The problem is uncontrolled. Pertinent negatives include no chest pain, headaches, palpitations or shortness of breath. Risk factors for coronary artery disease include diabetes mellitus, sedentary lifestyle, obesity and dyslipidemia.  Diabetes Pertinent negatives for hypoglycemia include no headaches. Pertinent negatives for diabetes include no chest pain.    Past Medical History:  Diagnosis Date   Allergy    seasonal allergies   Asthma    hx of - uses inhaler PRN   Diabetes mellitus without complication ([place])    diet controlled- not on meds at this time (05/07/2020)   Hypertension    on meds   Sleep apnea    sleeps in recliner due to orthopnea     Family History  Problem Relation Age of Onset   Healthy Mother    Lung cancer Father    Liver cancer Father    Diabetes Other    Esophageal cancer Neg Hx    Colon polyps Neg Hx    Colon cancer Neg Hx    Rectal cancer Neg Hx    Stomach cancer Neg Hx      Current Outpatient Medications:    albuterol (VENTOLIN HFA) 108 (90 Base) MCG/ACT inhaler, Inhale 2 puffs into the lungs every 6 (six) hours as needed for wheezing or shortness of breath., Disp: , Rfl:    amLODipine  (NORVASC) 10 MG tablet, Take 1 tablet (10 mg total) by mouth daily., Disp: 90 tablet, Rfl: 1   clobetasol cream (TEMOVATE) 0.05 %, Apply topically as needed., Disp: , Rfl:    clomiPHENE (CLOMID) 50 MG tablet, Take 25 mg by mouth daily., Disp: , Rfl:    clomiPRAMINE (ANAFRANIL) 25 MG capsule, Take 25 mg by mouth daily., Disp: , Rfl:    cyclobenzaprine (FLEXERIL) 10 MG tablet, Take 1 tablet (10 mg total) by mouth at bedtime as needed for muscle spasms., Disp: 30 tablet, Rfl: 0   fexofenadine (ALLEGRA) 180 MG tablet, Take 1 tablet (180 mg total) by mouth daily., Disp: 90 tablet, Rfl: 1   hydrALAZINE (APRESOLINE) 25 MG tablet, Take 1 tablet (25 mg total) by mouth 2 (two) times daily., Disp: 180 tablet, Rfl: 3   montelukast (SINGULAIR) 10 MG tablet, Take 1 tablet (10 mg total) by mouth daily., Disp: 90 tablet, Rfl: 0   Semaglutide,0.25 or 0.'5MG'$ /DOS, (OZEMPIC, 0.25 OR 0.5 MG/DOSE,) 2 MG/1.5ML SOPN, Inject 0.25 mg into the skin once a week., Disp: 4.5 mL, Rfl: 1   sildenafil (VIAGRA) 100 MG tablet, Take 100 mg by mouth daily., Disp: , Rfl:    tacrolimus (PROTOPIC) 0.1 % ointment, Apply topically as needed., Disp: , Rfl:    telmisartan-hydrochlorothiazide (MICARDIS HCT) 80-25 MG tablet, TAKE 1 TABLET BY MOUTH EVERY  DAY, Disp: 90 tablet, Rfl: 1   Allergies  Allergen Reactions   Pseudoephedrine Nausea And Vomiting   Pseudophed-Chlophedianol-Gg Nausea And Vomiting     Review of Systems  Respiratory:  Negative for shortness of breath.   Cardiovascular:  Negative for chest pain and palpitations.  Neurological:  Negative for headaches.    There were no vitals filed for this visit. There is no height or weight on file to calculate BMI.   Objective:  Physical Exam      Assessment And Plan:     1. HTN (hypertension), malignant  2. Uncontrolled type 2 diabetes mellitus with hyperglycemia [facility])     Patient was given opportunity to ask questions. Patient verbalized understanding of the plan and  was able to repeat key elements of the plan. All questions were answered to their satisfaction.  [address] [place], CMA   I, [place], [place], have reviewed all documentation for this visit. The documentation on 11/29/20 for the exam, diagnosis, procedures, and orders are all accurate and complete.   IF YOU HAVE BEEN REFERRED TO A SPECIALIST, IT MAY TAKE 1-2 WEEKS TO SCHEDULE/PROCESS THE REFERRAL. IF YOU HAVE NOT HEARD FROM US/SPECIALIST IN TWO WEEKS, PLEASE GIVE [place] A CALL AT [phone].   THE PATIENT IS ENCOURAGED TO PRACTICE SOCIAL DISTANCING DUE TO THE COVID-19 PANDEMIC.

## 2020-12-05 NOTE — Progress Notes (Signed)
Erroneous - no show

## 2021-01-12 LAB — HM DIABETES EYE EXAM

## 2021-01-17 ENCOUNTER — Encounter: Payer: Self-pay | Admitting: Internal Medicine

## 2021-03-23 ENCOUNTER — Encounter: Payer: BC Managed Care – PPO | Admitting: Nurse Practitioner

## 2021-03-24 ENCOUNTER — Encounter: Payer: BC Managed Care – PPO | Admitting: Internal Medicine

## 2021-03-26 ENCOUNTER — Other Ambulatory Visit: Payer: Self-pay | Admitting: Internal Medicine

## 2021-03-26 DIAGNOSIS — I10 Essential (primary) hypertension: Secondary | ICD-10-CM

## 2021-05-10 ENCOUNTER — Ambulatory Visit: Payer: BC Managed Care – PPO | Admitting: Internal Medicine

## 2021-05-10 ENCOUNTER — Encounter: Payer: Self-pay | Admitting: Internal Medicine

## 2021-05-10 ENCOUNTER — Other Ambulatory Visit: Payer: Self-pay

## 2021-05-10 VITALS — BP 154/92 | HR 67 | Temp 98.3°F | Ht 69.8 in | Wt 329.6 lb

## 2021-05-10 DIAGNOSIS — Z6841 Body Mass Index (BMI) 40.0 and over, adult: Secondary | ICD-10-CM

## 2021-05-10 DIAGNOSIS — I10 Essential (primary) hypertension: Secondary | ICD-10-CM

## 2021-05-10 DIAGNOSIS — Z2821 Immunization not carried out because of patient refusal: Secondary | ICD-10-CM

## 2021-05-10 DIAGNOSIS — E1165 Type 2 diabetes mellitus with hyperglycemia: Secondary | ICD-10-CM | POA: Diagnosis not present

## 2021-05-10 LAB — CMP14+EGFR
ALT: 26 IU/L (ref 0–44)
AST: 20 IU/L (ref 0–40)
Albumin/Globulin Ratio: 1.3 (ref 1.2–2.2)
Albumin: 4.3 g/dL (ref 4.0–5.0)
Alkaline Phosphatase: 105 IU/L (ref 44–121)
BUN/Creatinine Ratio: 13 (ref 9–20)
BUN: 13 mg/dL (ref 6–24)
Bilirubin Total: 0.4 mg/dL (ref 0.0–1.2)
CO2: 24 mmol/L (ref 20–29)
Calcium: 9.7 mg/dL (ref 8.7–10.2)
Chloride: 102 mmol/L (ref 96–106)
Creatinine, Ser: 1.03 mg/dL (ref 0.76–1.27)
Globulin, Total: 3.2 g/dL (ref 1.5–4.5)
Glucose: 122 mg/dL — ABNORMAL HIGH (ref 70–99)
Potassium: 3.9 mmol/L (ref 3.5–5.2)
Sodium: 140 mmol/L (ref 134–144)
Total Protein: 7.5 g/dL (ref 6.0–8.5)
eGFR: 90 mL/min/{1.73_m2} (ref >59)

## 2021-05-10 LAB — HEMOGLOBIN A1C
Est. average glucose Bld gHb Est-mCnc: 151 mg/dL
Hgb A1c MFr Bld: 6.9 % — ABNORMAL HIGH (ref 4.8–5.6)

## 2021-05-10 MED ORDER — WEGOVY 1 MG/0.5ML ~~LOC~~ SOAJ: 1.0000 mg | SUBCUTANEOUS | 0 refills | Status: DC

## 2021-05-10 NOTE — Progress Notes (Signed)
[name],acting as a Education administrator for [name], MD.,have documented all relevant documentation on the behalf of [name], MD,as directed by  [name], MD while in the presence of [name], MD.  This visit occurred during the SARS-CoV-2 public health emergency.  Safety protocols were in place, including screening questions prior to the visit, additional usage of staff PPE, and extensive cleaning of exam room while observing appropriate contact time as indicated for disinfecting solutions.  Subjective:     Patient ID: Drew Daniels , male    DOB: 07-30-1970 , 50 y.o.   MRN: [id number]   Chief Complaint  Patient presents with   Diabetes   Hypertension    HPI  Patient here for a blood pressure check. He is taking all of BP meds. He denies headaches, chest pain and shortness of breath.  He admits that he was out of Ozempic during [employer] television/film/video. He is back on the medication.   Diabetes He presents for his follow-up diabetic visit. He has type 2 diabetes mellitus. There are no hypoglycemic associated symptoms. Pertinent negatives for hypoglycemia include no headaches. Pertinent negatives for diabetes include no blurred vision, no chest pain, no polydipsia, no polyphagia and no polyuria. There are no hypoglycemic complications. He participates in exercise intermittently. An ACE inhibitor/angiotensin II receptor blocker is being taken. Eye exam is current.  Hypertension This is a chronic problem. The current episode started more than 1 year ago. The problem has been gradually improving since onset. The problem is uncontrolled. Pertinent negatives include no blurred vision, chest pain, headaches, palpitations or shortness of breath. Risk factors for coronary artery disease include diabetes mellitus, sedentary lifestyle, obesity and dyslipidemia.    Past Medical History:  Diagnosis Date   Allergy    seasonal allergies   Asthma    hx of - uses inhaler PRN    Diabetes mellitus without complication ([place])    diet controlled- not on meds at this time (05/07/2020)   Hypertension    on meds   Sleep apnea    sleeps in recliner due to orthopnea     Family History  Problem Relation Age of Onset   Healthy Mother    Lung cancer Father    Liver cancer Father    Diabetes Other    Esophageal cancer Neg Hx    Colon polyps Neg Hx    Colon cancer Neg Hx    Rectal cancer Neg Hx    Stomach cancer Neg Hx      Current Outpatient Medications:    albuterol (VENTOLIN HFA) 108 (90 Base) MCG/ACT inhaler, Inhale 2 puffs into the lungs every 6 (six) hours as needed for wheezing or shortness of breath., Disp: , Rfl:    amLODipine (NORVASC) 10 MG tablet, TAKE 1 TABLET(10 MG) BY MOUTH DAILY, Disp: 90 tablet, Rfl: 1   clobetasol cream (TEMOVATE) 0.05 %, Apply topically as needed., Disp: , Rfl:    cyclobenzaprine (FLEXERIL) 10 MG tablet, Take 1 tablet (10 mg total) by mouth at bedtime as needed for muscle spasms., Disp: 30 tablet, Rfl: 0   fexofenadine (ALLEGRA) 180 MG tablet, Take 1 tablet (180 mg total) by mouth daily., Disp: 90 tablet, Rfl: 1   hydrALAZINE (APRESOLINE) 25 MG tablet, Take 1 tablet (25 mg total) by mouth 2 (two) times daily., Disp: 180 tablet, Rfl: 3   montelukast (SINGULAIR) 10 MG tablet, Take 1 tablet (10 mg total) by mouth daily., Disp: 90 tablet, Rfl: 0  Semaglutide-Weight Management (WEGOVY) 1 MG/0.5ML SOAJ, Inject 1 mg into the skin once a week., Disp: 2 mL, Rfl: 0   sildenafil (VIAGRA) 100 MG tablet, Take 100 mg by mouth daily., Disp: , Rfl:    tacrolimus (PROTOPIC) 0.1 % ointment, Apply topically as needed., Disp: , Rfl:    telmisartan-hydrochlorothiazide (MICARDIS HCT) 80-25 MG tablet, TAKE 1 TABLET BY MOUTH EVERY DAY, Disp: 90 tablet, Rfl: 1   clomiPRAMINE (ANAFRANIL) 25 MG capsule, Take 25 mg by mouth daily., Disp: , Rfl:    Semaglutide,0.25 or 0.5MG/DOS, (OZEMPIC, 0.25 OR 0.5 MG/DOSE,) 2 MG/1.5ML SOPN, Inject 0.25 mg into the skin once a  week. (Patient not taking: Reported on 05/10/2021), Disp: 4.5 mL, Rfl: 1   Allergies  Allergen Reactions   Pseudoephedrine Nausea And Vomiting   Pseudophed-Chlophedianol-Gg Nausea And Vomiting     Review of Systems  Constitutional: Negative.   Eyes: Negative.  Negative for blurred vision.  Respiratory: Negative.  Negative for shortness of breath.   Cardiovascular: Negative.  Negative for chest pain and palpitations.  Gastrointestinal: Negative.   Endocrine: Negative for polydipsia, polyphagia and polyuria.  Neurological: Negative.  Negative for headaches.  Psychiatric/Behavioral: Negative.      Today's Vitals   05/10/21 1045 05/10/21 1126  BP: (!) 160/90 (!) 154/92  Pulse: 67   Temp: 98.3 F (36.8 C)   Weight: (!) 329 lb 9.6 oz (149.5 kg)   Height: 5' 9.8" (1.773 m)   PainSc: 0-No pain    Body mass index is 47.56 kg/m.  Wt Readings from Last 3 Encounters:  05/10/21 (!) 329 lb 9.6 oz (149.5 kg)  08/26/20 (!) 339 lb (153.8 kg)  06/09/20 (!) 346 lb 3.2 oz (157 kg)     Objective:  Physical Exam Vitals and nursing note reviewed.  Constitutional:      Appearance: Normal appearance. He is obese.  HENT:     Head: Normocephalic and atraumatic.     Nose:     Comments: Masked     Mouth/Throat:     Comments: Masked  Eyes:     Extraocular Movements: Extraocular movements intact.  Cardiovascular:     Rate and Rhythm: Normal rate and regular rhythm.     Heart sounds: Normal heart sounds.  Pulmonary:     Effort: Pulmonary effort is normal.     Breath sounds: Normal breath sounds.  Musculoskeletal:     Cervical back: Normal range of motion.  Skin:    General: Skin is warm.  Neurological:     General: No focal deficit present.     Mental Status: He is alert.  Psychiatric:        Mood and Affect: Mood normal.     Assessment And Plan:     1. Uncontrolled type 2 diabetes mellitus with hyperglycemia (HCC) Comments: Chronic, I will check labs as listed below. I will  increase Ozempic to 0.66m weekly. He is reminded to stop eating when full. I plan to increase dose to 113mafter 3-4 weeks.  - Hemoglobin A1c - CMP14+EGFR  2. HTN (hypertension), malignant Comments: Chronic, uncontrolled. I will change med regimen as follows: telmisartan/hct w/ hydralazine in am, amlodipine w/ hydralazine in PM. He will rto 2 wks for nurse visit. I will further adjust meds as needed. Importance of salt restriction and med compliance was d/w patient in detail.   3. Class 3 severe obesity due to excess calories with serious comorbidity and body mass index (BMI) of 45.0 to 49.9 in [facility]  Comments: BMI 47. We discussed the use of Wegovy to help w/ weight loss efforts. He again confirms that he does not have family h/o thyroid cancer. Since he is not semaglutide naive, I will send rx Wegovy 61m weekly. Pt advised I will have to do PA for his insurance to pay for the medication. He verbalizes understanding of treatment plan. Importance of regular exercise was again stressed to the patient.  4. Influenza vaccination declined   Patient was given opportunity to ask questions. Patient verbalized understanding of the plan and was able to repeat key elements of the plan. All questions were answered to their satisfaction.   I, RMaximino Greenland MD, have reviewed all documentation for this visit. The documentation on 05/10/21 for the exam, diagnosis, procedures, and orders are all accurate and complete.   IF YOU HAVE BEEN REFERRED TO A SPECIALIST, IT MAY TAKE 1-2 WEEKS TO SCHEDULE/PROCESS THE REFERRAL. IF YOU HAVE NOT HEARD FROM US/SPECIALIST IN TWO WEEKS, PLEASE GIVE [place] CALL AT [phone].   THE PATIENT IS ENCOURAGED TO PRACTICE SOCIAL DISTANCING DUE TO THE COVID-19 PANDEMIC.

## 2021-05-10 NOTE — Patient Instructions (Addendum)
Change blood pressure medication to this: Telmisartan/hct in AM/Hydralazine in AM Amlodipine PM/hydralazine in PM  Diabetes Mellitus and Nutrition, Adult When you have diabetes, or diabetes mellitus, it is very important to have healthy eating habits because your blood sugar (glucose) levels are greatly affected by what you eat and drink. Eating healthy foods in the right amounts, at about the same times every day, can help you: Manage your blood glucose. Lower your risk of heart disease. Improve your blood pressure. Reach or maintain a healthy weight. What can affect my meal plan? Every person with diabetes is different, and each person has different needs for a meal plan. Your health care provider may recommend that you work with a dietitian to make a meal plan that is best for you. Your meal plan may vary depending on factors such as: The calories you need. The medicines you take. Your weight. Your blood glucose, blood pressure, and cholesterol levels. Your activity level. Other health conditions you have, such as heart or kidney disease. How do carbohydrates affect me? Carbohydrates, also called carbs, affect your blood glucose level more than any other type of food. Eating carbs raises the amount of glucose in your blood. It is important to know how many carbs you can safely have in each meal. This is different for every person. Your dietitian can help you calculate how many carbs you should have at each meal and for each snack. How does alcohol affect me? Alcohol can cause a decrease in blood glucose (hypoglycemia), especially if you use insulin or take certain diabetes medicines by mouth. Hypoglycemia can be a life-threatening condition. Symptoms of hypoglycemia, such as sleepiness, dizziness, and confusion, are similar to symptoms of having too much alcohol. Do not drink alcohol if: Your health care provider tells you not to drink. You are pregnant, may be pregnant, or are planning  to become pregnant. If you drink alcohol: Limit how much you have to: 0-1 drink a day for women. 0-2 drinks a day for men. Know how much alcohol is in your drink. In the [place] one drink equals one 12 oz bottle of beer (355 mL), one 5 oz glass of wine (148 mL), or one 1 oz glass of hard liquor (44 mL). Keep yourself hydrated with water, diet soda, or unsweetened iced tea. Keep in mind that regular soda, juice, and other mixers may contain a lot of sugar and must be counted as carbs. What are tips for following this plan? Reading food labels Start by checking the serving size on the Nutrition Facts label of packaged foods and drinks. The number of calories and the amount of carbs, fats, and other nutrients listed on the label are based on one serving of the item. Many items contain more than one serving per package. Check the total grams (g) of carbs in one serving. Check the number of grams of saturated fats and trans fats in one serving. Choose foods that have a low amount or none of these fats. Check the number of milligrams (mg) of salt (sodium) in one serving. Most people should limit total sodium intake to less than 2,300 mg per day. Always check the nutrition information of foods labeled as "low-fat" or "nonfat." These foods may be higher in added sugar or refined carbs and should be avoided. Talk to your dietitian to identify your daily goals for nutrients listed on the label. Shopping Avoid buying canned, pre-made, or processed foods. These foods tend to be high in fat, sodium, and  added sugar. Shop around the outside edge of the grocery store. This is where you will most often find fresh fruits and vegetables, bulk grains, fresh meats, and fresh dairy products. Cooking Use low-heat cooking methods, such as baking, instead of high-heat cooking methods, such as deep frying. Cook using healthy oils, such as olive, canola, or sunflower oil. Avoid cooking with butter, cream, or high-fat  meats. Meal planning Eat meals and snacks regularly, preferably at the same times every day. Avoid going long periods of time without eating. Eat foods that are high in fiber, such as fresh fruits, vegetables, beans, and whole grains. Eat 4-6 oz (112-168 g) of lean protein each day, such as lean meat, chicken, fish, eggs, or tofu. One ounce (oz) (28 g) of lean protein is equal to: 1 oz (28 g) of meat, chicken, or fish. 1 egg.  cup (62 g) of tofu. Eat some foods each day that contain healthy fats, such as avocado, nuts, seeds, and fish. What foods should I eat? Fruits Berries. Apples. Oranges. Peaches. Apricots. Plums. Grapes. Mangoes. Papayas. Pomegranates. Kiwi. Cherries. Vegetables Leafy greens, including lettuce, spinach, kale, chard, collard greens, mustard greens, and cabbage. Beets. Cauliflower. Broccoli. Carrots. Green beans. Tomatoes. Peppers. Onions. Cucumbers. Brussels sprouts. Grains Whole grains, such as whole-wheat or whole-grain bread, crackers, tortillas, cereal, and pasta. Unsweetened oatmeal. Quinoa. Brown or wild rice. Meats and other proteins Seafood. Poultry without skin. Lean cuts of poultry and beef. Tofu. Nuts. Seeds. Dairy Low-fat or fat-free dairy products such as milk, yogurt, and cheese. The items listed above may not be a complete list of foods and beverages you can eat and drink. Contact a dietitian for more information. What foods should I avoid? Fruits Fruits canned with syrup. Vegetables Canned vegetables. Frozen vegetables with butter or cream sauce. Grains Refined white flour and flour products such as bread, pasta, snack foods, and cereals. Avoid all processed foods. Meats and other proteins Fatty cuts of meat. Poultry with skin. Breaded or fried meats. Processed meat. Avoid saturated fats. Dairy Full-fat yogurt, cheese, or milk. Beverages Sweetened drinks, such as soda or iced tea. The items listed above may not be a complete list of foods and  beverages you should avoid. Contact a dietitian for more information. Questions to ask a health care provider Do I need to meet with a certified diabetes care and education specialist? Do I need to meet with a dietitian? What number can I call if I have questions? When are the best times to check my blood glucose? Where to find more information: American Diabetes Association: diabetes.org Academy of Nutrition and Dietetics: eatright.[employer] of Diabetes and Digestive and Kidney Diseases: [employer]is Association of Diabetes Care & Education Specialists: diabeteseducator.org Summary It is important to have healthy eating habits because your blood sugar (glucose) levels are greatly affected by what you eat and drink. It is important to use alcohol carefully. A healthy meal plan will help you manage your blood glucose and lower your risk of heart disease. Your health care provider may recommend that you work with a dietitian to make a meal plan that is best for you. This information is not intended to replace advice given to you by your health care provider. Make sure you discuss any questions you have with your health care provider. Document Revised: 10/08/2019 Document Reviewed: 10/08/2019 Elsevier Patient Education  [place].

## 2021-05-11 ENCOUNTER — Encounter: Payer: Self-pay | Admitting: Internal Medicine

## 2021-05-31 ENCOUNTER — Other Ambulatory Visit: Payer: Self-pay

## 2021-05-31 ENCOUNTER — Encounter: Payer: BC Managed Care – PPO | Admitting: Internal Medicine

## 2021-05-31 ENCOUNTER — Ambulatory Visit: Payer: BC Managed Care – PPO

## 2021-05-31 ENCOUNTER — Encounter: Payer: Self-pay | Admitting: Internal Medicine

## 2021-05-31 VITALS — BP 134/80 | HR 61 | Temp 98.1°F | Ht 69.8 in | Wt 321.0 lb

## 2021-05-31 DIAGNOSIS — Z23 Encounter for immunization: Secondary | ICD-10-CM

## 2021-05-31 NOTE — Progress Notes (Signed)
Patient presents for [facility] and tdap vaccination. He is currently taking amlodipine 10mg  hydralazine 25mg  and telmisartan-hctz 80-25mg .   BP Readings from Last 3 Encounters:  09/30/21 (!) 159/90  07/26/21 118/80  05/31/21 134/80

## 2021-06-01 ENCOUNTER — Telehealth: Payer: Self-pay

## 2021-06-01 NOTE — Telephone Encounter (Signed)
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 I placed his form up front. YL,RMA ?

## 2021-06-04 ENCOUNTER — Encounter: Payer: Self-pay | Admitting: Internal Medicine

## 2021-06-06 ENCOUNTER — Encounter: Payer: Self-pay | Admitting: Internal Medicine

## 2021-06-09 ENCOUNTER — Encounter: Payer: Self-pay | Admitting: Internal Medicine

## 2021-06-09 ENCOUNTER — Other Ambulatory Visit: Payer: Self-pay

## 2021-06-09 MED ORDER — TELMISARTAN-HCTZ 80-25 MG PO TABS: 1.0000 | ORAL_TABLET | Freq: Every day | ORAL | 1 refills | Status: DC

## 2021-06-10 ENCOUNTER — Other Ambulatory Visit: Payer: Self-pay

## 2021-07-01 ENCOUNTER — Other Ambulatory Visit: Payer: Self-pay

## 2021-07-01 DIAGNOSIS — E1165 Type 2 diabetes mellitus with hyperglycemia: Secondary | ICD-10-CM

## 2021-07-01 MED ORDER — NOVOFINE PEN NEEDLE 32G X 6 MM MISC: 3 refills | Status: DC

## 2021-07-25 ENCOUNTER — Encounter: Payer: Self-pay | Admitting: Internal Medicine

## 2021-07-26 ENCOUNTER — Ambulatory Visit (INDEPENDENT_AMBULATORY_CARE_PROVIDER_SITE_OTHER): Payer: BC Managed Care – PPO | Admitting: Internal Medicine

## 2021-07-26 ENCOUNTER — Encounter: Payer: BC Managed Care – PPO | Admitting: Internal Medicine

## 2021-07-26 ENCOUNTER — Encounter: Payer: Self-pay | Admitting: Internal Medicine

## 2021-07-26 VITALS — BP 118/80 | HR 61 | Temp 98.3°F | Ht 69.8 in | Wt 314.4 lb

## 2021-07-26 DIAGNOSIS — Z1211 Encounter for screening for malignant neoplasm of colon: Secondary | ICD-10-CM | POA: Diagnosis not present

## 2021-07-26 DIAGNOSIS — E1165 Type 2 diabetes mellitus with hyperglycemia: Secondary | ICD-10-CM

## 2021-07-26 DIAGNOSIS — I493 Ventricular premature depolarization: Secondary | ICD-10-CM

## 2021-07-26 DIAGNOSIS — I10 Essential (primary) hypertension: Secondary | ICD-10-CM

## 2021-07-26 DIAGNOSIS — Z0000 Encounter for general adult medical examination without abnormal findings: Secondary | ICD-10-CM

## 2021-07-26 DIAGNOSIS — E7800 Pure hypercholesterolemia, unspecified: Secondary | ICD-10-CM

## 2021-07-26 DIAGNOSIS — E291 Testicular hypofunction: Secondary | ICD-10-CM

## 2021-07-26 DIAGNOSIS — Z6841 Body Mass Index (BMI) 40.0 and over, adult: Secondary | ICD-10-CM

## 2021-07-26 LAB — POCT URINALYSIS DIPSTICK
Bilirubin, UA: NEGATIVE
Blood, UA: NEGATIVE
Glucose, UA: NEGATIVE
Ketones, UA: NEGATIVE
Leukocytes, UA: NEGATIVE
Nitrite, UA: NEGATIVE
Protein, UA: NEGATIVE
Spec Grav, UA: 1.025 (ref 1.010–1.025)
Urobilinogen, UA: 0.2 E.U./dL
pH, UA: 5.5 (ref 5.0–8.0)

## 2021-07-26 LAB — POC HEMOCCULT BLD/STL (OFFICE/1-CARD/DIAGNOSTIC)
Card #1 Date: 5092023
Fecal Occult Blood, POC: NEGATIVE

## 2021-07-26 MED ORDER — WEGOVY 1 MG/0.5ML ~~LOC~~ SOAJ
1.0000 mg | SUBCUTANEOUS | 2 refills | Status: DC
Start: 2021-07-26 — End: 2022-08-08

## 2021-07-26 NOTE — Patient Instructions (Signed)
Health Maintenance, Male Adopting a healthy lifestyle and getting preventive care are important in promoting health and wellness. Ask your health care provider about: The right schedule for you to have regular tests and exams. Things you can do on your own to prevent diseases and keep yourself healthy. What should I know about diet, weight, and exercise? Eat a healthy diet  Eat a diet that includes plenty of vegetables, fruits, low-fat dairy products, and lean protein. Do not eat a lot of foods that are high in solid fats, added sugars, or sodium. Maintain a healthy weight Body mass index (BMI) is a measurement that can be used to identify possible weight problems. It estimates body fat based on height and weight. Your health care provider can help determine your BMI and help you achieve or maintain a healthy weight. Get regular exercise Get regular exercise. This is one of the most important things you can do for your health. Most adults should: Exercise for at least 150 minutes each week. The exercise should increase your heart rate and make you sweat (moderate-intensity exercise). Do strengthening exercises at least twice a week. This is in addition to the moderate-intensity exercise. Spend less time sitting. Even light physical activity can be beneficial. Watch cholesterol and blood lipids Have your blood tested for lipids and cholesterol at 49 years of age, then have this test every 5 years. You may need to have your cholesterol levels checked more often if: Your lipid or cholesterol levels are high. You are older than 49 years of age. You are at high risk for heart disease. What should I know about cancer screening? Many types of cancers can be detected early and may often be prevented. Depending on your health history and family history, you may need to have cancer screening at various ages. This may include screening for: Colorectal cancer. Prostate cancer. Skin cancer. Lung  cancer. What should I know about heart disease, diabetes, and high blood pressure? Blood pressure and heart disease High blood pressure causes heart disease and increases the risk of stroke. This is more likely to develop in people who have high blood pressure readings or are overweight. Talk with your health care provider about your target blood pressure readings. Have your blood pressure checked: Every 3-5 years if you are 18-39 years of age. Every year if you are 40 years old or older. If you are between the ages of 65 and 75 and are a current or former smoker, ask your health care provider if you should have a one-time screening for abdominal aortic aneurysm (AAA). Diabetes Have regular diabetes screenings. This checks your fasting blood sugar level. Have the screening done: Once every three years after age 45 if you are at a normal weight and have a low risk for diabetes. More often and at a younger age if you are overweight or have a high risk for diabetes. What should I know about preventing infection? Hepatitis B If you have a higher risk for hepatitis B, you should be screened for this virus. Talk with your health care provider to find out if you are at risk for hepatitis B infection. Hepatitis C Blood testing is recommended for: Everyone born from 1945 through 1965. Anyone with known risk factors for hepatitis C. Sexually transmitted infections (STIs) You should be screened each year for STIs, including gonorrhea and chlamydia, if: You are sexually active and are younger than 49 years of age. You are older than 49 years of age and your   health care provider tells you that you are at risk for this type of infection. Your sexual activity has changed since you were last screened, and you are at increased risk for chlamydia or gonorrhea. Ask your health care provider if you are at risk. Ask your health care provider about whether you are at high risk for HIV. Your health care provider  may recommend a prescription medicine to help prevent HIV infection. If you choose to take medicine to prevent HIV, you should first get tested for HIV. You should then be tested every 3 months for as long as you are taking the medicine. Follow these instructions at home: Alcohol use Do not drink alcohol if your health care provider tells you not to drink. If you drink alcohol: Limit how much you have to 0-2 drinks a day. Know how much alcohol is in your drink. In the [place] one drink equals one 12 oz bottle of beer (355 mL), one 5 oz glass of wine (148 mL), or one 1 oz glass of hard liquor (44 mL). Lifestyle Do not use any products that contain nicotine or tobacco. These products include cigarettes, chewing tobacco, and vaping devices, such as e-cigarettes. If you need help quitting, ask your health care provider. Do not use street drugs. Do not share needles. Ask your health care provider for help if you need support or information about quitting drugs. General instructions Schedule regular health, dental, and eye exams. Stay current with your vaccines. Tell your health care provider if: You often feel depressed. You have ever been abused or do not feel safe at home. Summary Adopting a healthy lifestyle and getting preventive care are important in promoting health and wellness. Follow your health care provider's instructions about healthy diet, exercising, and getting tested or screened for diseases. Follow your health care provider's instructions on monitoring your cholesterol and blood pressure. This information is not intended to replace advice given to you by your health care provider. Make sure you discuss any questions you have with your health care provider. Document Revised: 07/26/2020 Document Reviewed: 07/26/2020 Elsevier Patient Education  2023 Elsevier Inc.  

## 2021-07-26 NOTE — Progress Notes (Signed)
?Drew Daniels,acting as a Education administrator for Drew Greenland, MD.,have documented all relevant documentation on the behalf of Drew Greenland, MD,as directed by  Drew Greenland, MD while in the presence of Drew Greenland, MD.  ?This visit occurred during the SARS-CoV-2 public health emergency.  Safety protocols were in place, including screening questions prior to the visit, additional usage of staff PPE, and extensive cleaning of exam room while observing appropriate contact time as indicated for disinfecting solutions. ? ?Subjective:  ?  ? Patient ID: Drew Daniels , male    DOB: Oct 18, 1970 , 51 y.o.   MRN: [id number] ? ? ?Chief Complaint  ?Patient presents with  ? Annual Exam  ? Diabetes  ? Hypertension  ? ? ?HPI ? ?He is here today for a full physical examination. He is now taking Wegovy for weight loss. He is now exercising 2-3 days per week for one hour. He is He has no specific concerns or complaints at this time.  ? ?Diabetes ?He presents for his follow-up diabetic visit. He has type 2 diabetes mellitus. There are no hypoglycemic associated symptoms. Pertinent negatives for diabetes include no blurred vision. There are no hypoglycemic complications. He participates in exercise intermittently. An ACE inhibitor/angiotensin II receptor blocker is being taken. Eye exam is current.  ?Hypertension ?This is a chronic problem. The current episode started more than 1 year ago. The problem has been gradually improving since onset. Pertinent negatives include no blurred vision. Risk factors for coronary artery disease include diabetes mellitus, dyslipidemia, male gender, obesity and sedentary lifestyle. The current treatment provides moderate improvement. Compliance problems include exercise.    ? ?Past Medical History:  ?Diagnosis Date  ? Allergy   ? seasonal allergies  ? Asthma   ? hx of - uses inhaler PRN  ? Diabetes mellitus without complication ([place])   ? diet controlled- not on meds at this time (05/07/2020)   ? Hypertension   ? on meds  ? Sleep apnea   ? sleeps in recliner due to orthopnea  ?  ? ?Family History  ?Problem Relation Age of Onset  ? Healthy Mother   ? Lung cancer Father   ? Liver cancer Father   ? Diabetes Other   ? Esophageal cancer Neg Hx   ? Colon polyps Neg Hx   ? Colon cancer Neg Hx   ? Rectal cancer Neg Hx   ? Stomach cancer Neg Hx   ? ? ? ?Current Outpatient Medications:  ?  albuterol (VENTOLIN HFA) 108 (90 Base) MCG/ACT inhaler, Inhale 2 puffs into the lungs every 6 (six) hours as needed for wheezing or shortness of breath., Disp: , Rfl:  ?  amLODipine (NORVASC) 10 MG tablet, TAKE 1 TABLET(10 MG) BY MOUTH DAILY, Disp: 90 tablet, Rfl: 1 ?  clobetasol cream (TEMOVATE) 0.05 %, Apply topically as needed., Disp: , Rfl:  ?  cyclobenzaprine (FLEXERIL) 10 MG tablet, Take 1 tablet (10 mg total) by mouth at bedtime as needed for muscle spasms., Disp: 30 tablet, Rfl: 0 ?  fexofenadine (ALLEGRA) 180 MG tablet, Take 1 tablet (180 mg total) by mouth daily., Disp: 90 tablet, Rfl: 1 ?  hydrALAZINE (APRESOLINE) 25 MG tablet, Take 1 tablet (25 mg total) by mouth 2 (two) times daily., Disp: 180 tablet, Rfl: 3 ?  Insulin Pen Needle (NOVOFINE PEN NEEDLE) 32G X 6 MM MISC, Use with ozempic, Disp: 100 each, Rfl: 3 ?  montelukast (SINGULAIR) 10 MG tablet, Take 1 tablet (10 mg total)  by mouth daily., Disp: 90 tablet, Rfl: 0 ?  sildenafil (VIAGRA) 100 MG tablet, Take 100 mg by mouth daily., Disp: , Rfl:  ?  tacrolimus (PROTOPIC) 0.1 % ointment, Apply topically as needed., Disp: , Rfl:  ?  telmisartan-hydrochlorothiazide (MICARDIS HCT) 80-25 MG tablet, Take 1 tablet by mouth daily., Disp: 90 tablet, Rfl: 1 ?  Semaglutide-Weight Management (WEGOVY) 1 MG/0.5ML SOAJ, Inject 1 mg into the skin once a week., Disp: 2 mL, Rfl: 2  ? ?Allergies  ?Allergen Reactions  ? Pseudoephedrine Nausea And Vomiting  ? Pseudophed-Chlophedianol-Gg Nausea And Vomiting  ?  ? ?Men's preventive visit. Patient Health Questionnaire (PHQ-2) is  ?[place] Office Visit from 05/10/2021 in [facility]  ?PHQ-2 Total Score 0  ? ?  ?. Patient is on a diabetic diet. Marital status: Married. Relevant history for alcohol use is:  ?Social History  ? ?Substance and Sexual Activity  ?Alcohol Use No  ?. Relevant history for tobacco use is:  ?Social History  ? ?Tobacco Use  ?Smoking Status Never  ?Smokeless Tobacco Never  ?.  ? ?Review of Systems  ?Constitutional: Negative.   ?HENT: Negative.    ?Eyes: Negative.  Negative for blurred vision.  ?Respiratory: Negative.    ?Cardiovascular: Negative.   ?Gastrointestinal: Negative.   ?Endocrine: Negative.   ?Genitourinary: Negative.   ?Musculoskeletal: Negative.   ?Skin: Negative.   ?Neurological: Negative.   ?Hematological: Negative.   ?Psychiatric/Behavioral: Negative.     ? ?Today's Vitals  ? 07/26/21 1054  ?BP: 118/80  ?Pulse: 61  ?Temp: 98.3 ?F (36.8 ?C)  ?Weight: (!) 314 lb 6.4 oz (142.6 kg)  ?Height: 5' 9.8" (1.773 m)  ?PainSc: 0-No pain  ? ?Body mass index is 45.37 kg/m?.  ?Wt Readings from Last 3 Encounters:  ?07/26/21 (!) 314 lb 6.4 oz (142.6 kg)  ?05/31/21 (!) 321 lb (145.6 kg)  ?05/10/21 (!) 329 lb 9.6 oz (149.5 kg)  ?  ? ?Objective:  ?Physical Exam ?Vitals and nursing note reviewed.  ?Constitutional:   ?   Appearance: Normal appearance. He is obese.  ?HENT:  ?   Head: Normocephalic and atraumatic.  ?   Right Ear: Tympanic membrane, ear canal and external ear normal.  ?   Left Ear: Tympanic membrane, ear canal and external ear normal.  ?   Mouth/Throat:  ?   Mouth: Mucous membranes are moist.  ?   Pharynx: Oropharynx is clear.  ?Eyes:  ?   Extraocular Movements: Extraocular movements intact.  ?   Conjunctiva/sclera: Conjunctivae normal.  ?   Pupils: Pupils are equal, round, and reactive to light.  ?Cardiovascular:  ?   Rate and Rhythm: Normal rate and regular rhythm.  ?   Pulses: Normal pulses.     ?     Dorsalis pedis pulses are 2+ on the right side and 2+ on the left side.  ?   Heart sounds:  Normal heart sounds.  ?Pulmonary:  ?   Effort: Pulmonary effort is normal.  ?   Breath sounds: Normal breath sounds.  ?Chest:  ?Breasts: ?   Right: Normal. No swelling, bleeding, inverted nipple, mass or nipple discharge.  ?   Left: Normal. No swelling, bleeding, inverted nipple, mass or nipple discharge.  ?Abdominal:  ?   General: Bowel sounds are normal.  ?   Palpations: Abdomen is soft.  ?   Comments: Obese, soft. Difficult to assess organomegaly.   ?Genitourinary: ?   Prostate: Normal.  ?   Rectum: Normal.  Guaiac result negative.  ?Musculoskeletal:     ?   General: Normal range of motion.  ?   Cervical back: Normal range of motion and neck supple.  ?Feet:  ?   Right foot:  ?   Protective Sensation: 5 sites tested.  5 sites sensed.  ?   Skin integrity: Callus and dry skin present.  ?   Toenail Condition: Right toenails are long.  ?   Left foot:  ?   Protective Sensation: 5 sites tested.  5 sites sensed.  ?   Skin integrity: Callus and dry skin present.  ?   Toenail Condition: Left toenails are long.  ?Skin: ?   General: Skin is warm.  ?Neurological:  ?   General: No focal deficit present.  ?   Mental Status: He is alert.  ?Psychiatric:     ?   Mood and Affect: Mood normal.     ?   Behavior: Behavior normal.  ?   ?Assessment And Plan:  ?  ?1. Encounter for general adult medical examination w/o abnormal findings ?Comments: A full exam was performed. DRE performed, stool is heme negative.  PATIENT IS ADVISED TO GET 30-45 MINUTES REGULAR EXERCISE NO LESS THAN FOUR TO FIVE DAYS PER WEEK - BOTH WEIGHTBEARING EXERCISES AND AEROBIC ARE RECOMMENDED.  PATIENT IS ADVISED TO FOLLOW A HEALTHY DIET WITH AT LEAST SIX FRUITS/VEGGIES PER DAY, DECREASE INTAKE OF RED MEAT, AND TO INCREASE FISH INTAKE TO TWO DAYS PER WEEK.  MEATS/FISH SHOULD NOT BE FRIED, BAKED OR BROILED IS PREFERABLE.  IT IS ALSO IMPORTANT TO CUT BACK ON YOUR SUGAR INTAKE. PLEASE AVOID ANYTHING WITH ADDED SUGAR, CORN SYRUP OR OTHER SWEETENERS. IF YOU MUST USE A  SWEETENER, YOU CAN TRY STEVIA. IT IS ALSO IMPORTANT TO AVOID ARTIFICIALLY SWEETENERS AND DIET BEVERAGES. LASTLY, I SUGGEST WEARING SPF 50 SUNSCREEN ON EXPOSED PARTS AND ESPECIALLY WHEN IN THE DIRECT SUNLIGHT

## 2021-07-27 LAB — LIPID PANEL
Chol/HDL Ratio: 5.2 ratio — ABNORMAL HIGH (ref 0.0–5.0)
Cholesterol, Total: 166 mg/dL (ref 100–199)
HDL: 32 mg/dL — ABNORMAL LOW (ref >39)
LDL Chol Calc (NIH): 117 mg/dL — ABNORMAL HIGH (ref 0–99)
Triglycerides: 91 mg/dL (ref 0–149)
VLDL Cholesterol Cal: 17 mg/dL (ref 5–40)

## 2021-07-27 LAB — MICROALBUMIN / CREATININE URINE RATIO
Creatinine, Urine: 335.9 mg/dL
Microalb/Creat Ratio: 8 mg/g creat (ref 0–29)
Microalbumin, Urine: 26.5 ug/mL

## 2021-07-31 ENCOUNTER — Other Ambulatory Visit: Payer: Self-pay | Admitting: Internal Medicine

## 2021-08-05 LAB — BMP8+EGFR
BUN/Creatinine Ratio: 11 (ref 9–20)
BUN: 13 mg/dL (ref 6–24)
CO2: 23 mmol/L (ref 20–29)
Calcium: 9.1 mg/dL (ref 8.7–10.2)
Chloride: 100 mmol/L (ref 96–106)
Creatinine, Ser: 1.15 mg/dL (ref 0.76–1.27)
Glucose: 81 mg/dL (ref 70–99)
Potassium: 3.9 mmol/L (ref 3.5–5.2)
eGFR: 79 mL/min/{1.73_m2} (ref >59)

## 2021-08-05 LAB — CBC
Hematocrit: 42.3 % (ref 37.5–51.0)
Hemoglobin: 14.3 g/dL (ref 13.0–17.7)
MCH: 30 pg (ref 26.6–33.0)
MCHC: 33.8 g/dL (ref 31.5–35.7)
MCV: 89 fL (ref 79–97)
Platelets: 309 10*3/uL (ref 150–450)
RBC: 4.77 x10E6/uL (ref 4.14–5.80)
RDW: 13 % (ref 11.6–15.4)
WBC: 6.1 10*3/uL (ref 3.4–10.8)

## 2021-08-05 LAB — PSA: Prostate Specific Ag, Serum: 0.7 ng/mL (ref 0.0–4.0)

## 2021-08-05 LAB — LUTEINIZING HORMONE: LH: 5.7 m[IU]/mL (ref 1.7–8.6)

## 2021-08-05 LAB — TESTOSTERONE,FREE AND TOTAL
Testosterone, Free: 9.6 pg/mL (ref 6.8–21.5)
Testosterone: 234 ng/dL — ABNORMAL LOW (ref 264–916)

## 2021-08-05 LAB — TSH: TSH: 1.75 u[IU]/mL (ref 0.450–4.500)

## 2021-08-05 LAB — HEMOGLOBIN A1C
Est. average glucose Bld gHb Est-mCnc: 128 mg/dL
Hgb A1c MFr Bld: 6.1 % — ABNORMAL HIGH (ref 4.8–5.6)

## 2021-08-05 LAB — MAGNESIUM: Magnesium: 2.3 mg/dL (ref 1.6–2.3)

## 2021-08-12 ENCOUNTER — Other Ambulatory Visit: Payer: Self-pay

## 2021-08-12 MED ORDER — ATORVASTATIN CALCIUM 20 MG PO TABS: 20.0000 mg | ORAL_TABLET | Freq: Every day | ORAL | 11 refills | Status: AC

## 2021-08-25 ENCOUNTER — Encounter: Payer: Self-pay | Admitting: Internal Medicine

## 2021-08-25 ENCOUNTER — Other Ambulatory Visit: Payer: Self-pay

## 2021-08-25 MED ORDER — MONTELUKAST SODIUM 10 MG PO TABS
10.0000 mg | ORAL_TABLET | Freq: Every day | ORAL | 1 refills | Status: AC
Start: 2021-08-25 — End: ?

## 2021-09-19 ENCOUNTER — Encounter: Payer: BC Managed Care – PPO | Admitting: Internal Medicine

## 2021-09-23 ENCOUNTER — Other Ambulatory Visit: Payer: Self-pay

## 2021-09-23 DIAGNOSIS — R7989 Other specified abnormal findings of blood chemistry: Secondary | ICD-10-CM

## 2021-09-23 DIAGNOSIS — N529 Male erectile dysfunction, unspecified: Secondary | ICD-10-CM

## 2021-09-26 ENCOUNTER — Other Ambulatory Visit: Payer: Self-pay | Admitting: Internal Medicine

## 2021-09-26 ENCOUNTER — Other Ambulatory Visit: Payer: Self-pay

## 2021-09-26 DIAGNOSIS — N529 Male erectile dysfunction, unspecified: Secondary | ICD-10-CM

## 2021-09-26 DIAGNOSIS — R7989 Other specified abnormal findings of blood chemistry: Secondary | ICD-10-CM

## 2021-09-30 ENCOUNTER — Encounter: Payer: Self-pay | Admitting: Urology

## 2021-09-30 ENCOUNTER — Ambulatory Visit (INDEPENDENT_AMBULATORY_CARE_PROVIDER_SITE_OTHER): Payer: BC Managed Care – PPO | Admitting: Urology

## 2021-09-30 VITALS — BP 159/90 | HR 49 | Ht 72.0 in | Wt 315.0 lb

## 2021-09-30 DIAGNOSIS — R7989 Other specified abnormal findings of blood chemistry: Secondary | ICD-10-CM | POA: Insufficient documentation

## 2021-09-30 DIAGNOSIS — E291 Testicular hypofunction: Secondary | ICD-10-CM

## 2021-09-30 NOTE — Progress Notes (Addendum)
Assessment: 1. Low testosterone   2. Hypogonadism in male     Plan: I reviewed the patient's records including his prior urologic evaluation and lab results. Diagnosis and management options for low testosterone discussed with the patient.  Specifically I discussed the options including short acting injections, topical therapy, oral therapy, long-acting injections, and subcutaneous pellets.  Potential side effects of testosterone replacement therapy including erythrocytosis, worsening of sleep apnea, possible cardiovascular events, worsening of undiagnosed prostate cancer reviewed. Testosterone free and total, prolactin, SHBG, albumin, estradiol today Will call with results and to discuss treatment options further.  Chief Complaint:  Chief Complaint  Patient presents with   Low testosterone    History of Present Illness:  Drew Daniels is a 51 y.o. year old male who is seen in consultation from Fanny Bien, MD for evaluation of low testosterone.  He has a 2-year history of hypogonadal symptoms.  He reports decreased energy, decreased strength, easy fatigability, and decreased libido.  He does have occasional difficulty with erectile dysfunction.  He has previously seen Dr. Abner Greenspan at [facility] with his last visit in early 2022.  He was treated with Clomid 25 mg daily at that time due to his desire to maintain fertility.  He has not been on any treatment for low testosterone recently.  He continues to have symptoms of decreased libido, lack of energy, lack of strength, easy fatigability, and erectile dysfunction. ADAM score = 6.  Testosterone levels: 9/21 222 9/21 154 5/23 234  Labs from 5/23: LH Normal  H&H 14.3/42.3    PSA  0.7     He is no longer interested in maintaining his fertility. He has low urinary tract symptoms including frequency, urgency, and nocturia.  No dysuria or gross hematuria. IPSS = 13 today.  Past Medical History:  Past Medical History:   Diagnosis Date   Allergy    seasonal allergies   Asthma    hx of - uses inhaler PRN   Diabetes mellitus without complication ([place])    diet controlled- not on meds at this time (05/07/2020)   Hypertension    on meds   Sleep apnea    sleeps in recliner due to orthopnea    Past Surgical History:  Past Surgical History:  Procedure Laterality Date   ACHILLES TENDON SURGERY Left 2005   COLONOSCOPY  2022   KNEE ARTHROSCOPY Right 1989    Allergies:  Allergies  Allergen Reactions   Pseudoephedrine Nausea And Vomiting   Pseudophed-Chlophedianol-Gg Nausea And Vomiting    Family History:  Family History  Problem Relation Age of Onset   Healthy Mother    Lung cancer Father    Liver cancer Father    Diabetes Other    Esophageal cancer Neg Hx    Colon polyps Neg Hx    Colon cancer Neg Hx    Rectal cancer Neg Hx    Stomach cancer Neg Hx     Social History:  Social History   Tobacco Use   Smoking status: Never   Smokeless tobacco: Never  Vaping Use   Vaping Use: Never used  Substance Use Topics   Alcohol use: No   Drug use: Not Currently    Review of symptoms:  Constitutional:  Negative for unexplained weight loss, night sweats, fever, chills ENT:  Negative for nose bleeds, sinus pain, painful swallowing CV:  Negative for chest pain, shortness of breath, exercise intolerance, palpitations, loss of consciousness Resp:  Negative for cough, wheezing, shortness  of breath GI:  Negative for nausea, vomiting, diarrhea, bloody stools GU:  Positives noted in HPI; otherwise negative for gross hematuria, dysuria, urinary incontinence Neuro:  Negative for seizures, poor balance, limb weakness, slurred speech Psych:  Negative for lack of energy, depression, anxiety Endocrine:  Negative for polydipsia, polyuria, symptoms of hypoglycemia (dizziness, hunger, sweating) Hematologic:  Negative for anemia, purpura, petechia, prolonged or excessive bleeding, use of anticoagulants   Allergic:  Negative for difficulty breathing or choking as a result of exposure to anything; no shellfish allergy; no allergic response (rash/itch) to materials, foods  Physical exam: BP (!) 159/90   Pulse (!) 49   Ht 6' (1.829 m)   Wt (!) 315 lb (142.9 kg)   BMI 42.72 kg/m  GENERAL APPEARANCE:  Well appearing, well developed, well nourished, NAD; obese HEENT: Atraumatic, Normocephalic, oropharynx clear. NECK: Supple without lymphadenopathy or thyromegaly. LUNGS: Clear to auscultation bilaterally. HEART: Regular Rate and Rhythm without murmurs, gallops, or rubs. ABDOMEN: Soft, non-tender, No Masses. EXTREMITIES: Moves all extremities well.  Without clubbing, cyanosis, or edema. NEUROLOGIC:  Alert and oriented x 3, normal gait, CN II-XII grossly intact.  MENTAL STATUS:  Appropriate. BACK:  Non-tender to palpation.  No CVAT SKIN:  Warm, dry and intact.   GU: Penis:  uncircumcised Meatus: Normal Scrotum: normal, no masses Testis: normal without masses bilateral  Results: None

## 2021-10-02 ENCOUNTER — Encounter: Payer: Self-pay | Admitting: Internal Medicine

## 2021-10-03 ENCOUNTER — Other Ambulatory Visit (HOSPITAL_COMMUNITY): Payer: Self-pay

## 2021-10-03 ENCOUNTER — Other Ambulatory Visit: Payer: Self-pay

## 2021-10-03 MED ORDER — WEGOVY 1.7 MG/0.75ML ~~LOC~~ SOAJ
1.7000 mg | SUBCUTANEOUS | 0 refills | Status: DC
  Filled 2021-10-03: qty 3, 28d supply, fill #0

## 2021-10-04 ENCOUNTER — Other Ambulatory Visit: Payer: Self-pay | Admitting: Internal Medicine

## 2021-10-04 DIAGNOSIS — I10 Essential (primary) hypertension: Secondary | ICD-10-CM

## 2021-10-06 LAB — ESTRADIOL: Estradiol: 35.5 pg/mL (ref 7.6–42.6)

## 2021-10-06 LAB — TESTOSTERONE, FREE, TOTAL, SHBG
Sex Hormone Binding: 27.3 nmol/L (ref 16.5–55.9)
Testosterone, Free: 8.3 pg/mL (ref 6.8–21.5)
Testosterone: 287 ng/dL (ref 264–916)

## 2021-10-06 LAB — ALBUMIN: Albumin: 4 g/dL — ABNORMAL LOW (ref 4.1–5.1)

## 2021-10-06 LAB — PROLACTIN: Prolactin: 8.6 ng/mL (ref 4.0–15.2)

## 2021-10-07 ENCOUNTER — Encounter: Payer: Self-pay | Admitting: Urology

## 2021-10-09 ENCOUNTER — Encounter: Payer: Self-pay | Admitting: Internal Medicine

## 2021-10-10 ENCOUNTER — Other Ambulatory Visit: Payer: BC Managed Care – PPO

## 2021-10-10 DIAGNOSIS — E7800 Pure hypercholesterolemia, unspecified: Secondary | ICD-10-CM

## 2021-10-10 LAB — LIPID PANEL
Cholesterol: 116 (ref 0–200)
HDL: 31 — AB (ref 35–70)
LDL Cholesterol: 70
Triglycerides: 69 (ref 40–160)

## 2021-10-12 ENCOUNTER — Encounter: Payer: Self-pay | Admitting: Internal Medicine

## 2021-10-31 ENCOUNTER — Ambulatory Visit: Payer: BC Managed Care – PPO | Admitting: Internal Medicine

## 2021-11-08 ENCOUNTER — Ambulatory Visit: Payer: BC Managed Care – PPO | Admitting: Internal Medicine

## 2021-11-08 ENCOUNTER — Encounter: Payer: Self-pay | Admitting: Internal Medicine

## 2021-11-08 VITALS — Temp 98.0°F | Resp 16 | Ht 71.0 in | Wt 314.0 lb

## 2021-11-08 DIAGNOSIS — E785 Hyperlipidemia, unspecified: Secondary | ICD-10-CM

## 2021-11-08 DIAGNOSIS — I10 Essential (primary) hypertension: Secondary | ICD-10-CM | POA: Insufficient documentation

## 2021-11-08 DIAGNOSIS — I480 Paroxysmal atrial fibrillation: Secondary | ICD-10-CM | POA: Insufficient documentation

## 2021-11-08 MED ORDER — HYDRALAZINE HCL 50 MG PO TABS: 50.0000 mg | ORAL_TABLET | Freq: Two times a day (BID) | ORAL | 2 refills | Status: DC

## 2021-11-08 MED ORDER — METOPROLOL SUCCINATE ER 25 MG PO TB24: 25.0000 mg | ORAL_TABLET | Freq: Every day | ORAL | 2 refills | Status: DC

## 2021-11-08 NOTE — Progress Notes (Signed)
Primary Physician/Referring:  Fanny Bien, MD  Patient ID: Drew Daniels, male    DOB: 05/31/1970, 51 y.o.   MRN: [id number]  Chief Complaint  Patient presents with  . Atrial Fibrillation  . DOE  . New Patient (Initial Visit)   HPI:    Drew Daniels  is a 51 y.o. male with past medical history significant for hypertension, diabetes, and new onset atrial fibrillation who is here to establish care with cardiology.  Patient is currently on an injectable for diabetes and weight loss.  He would eventually like to get off of his blood pressure medications, however, without significant weight loss we discussed that this is not possible.  He has been experiencing dyspnea on exertion along with palpitations.  Patient has never had a stress test or echo in the past.  He denies chest pain, shortness of breath at rest, diaphoresis, syncope, PND, edema, orthopnea.  Patient does not smoke or drink alcohol.  He is on Xarelto for his atrial fibrillation.  We can discuss continuing or discontinuing his blood thinner depending on what echo and stress test show, patient is agreeable to this.  Past Medical History:  Diagnosis Date  . Allergy    seasonal allergies  . Asthma    hx of - uses inhaler PRN  . Diabetes mellitus without complication ([place])    diet controlled- not on meds at this time (05/07/2020)  . Hypertension    on meds  . Sleep apnea    sleeps in recliner due to orthopnea   Past Surgical History:  Procedure Laterality Date  . ACHILLES TENDON SURGERY Left 2005  . COLONOSCOPY  2022  . KNEE ARTHROSCOPY Right 1989   Family History  Problem Relation Age of Onset  . Healthy Mother   . Lung cancer Father   . Liver cancer Father   . Diabetes Other   . Esophageal cancer Neg Hx   . Colon polyps Neg Hx   . Colon cancer Neg Hx   . Rectal cancer Neg Hx   . Stomach cancer Neg Hx     Social History   Tobacco Use  . Smoking status: Never  . Smokeless tobacco: Never  Substance  Use Topics  . Alcohol use: No   Marital Status: Married  ROS  Review of Systems  Cardiovascular:  Positive for dyspnea on exertion, irregular heartbeat and palpitations.  All other systems reviewed and are negative. Objective  Temperature 98 F (36.7 C), resp. rate 16, height '5\' 11"'  (1.803 m), weight (!) 314 lb (142.4 kg), SpO2 96 %. Body mass index is 43.79 kg/m.     11/08/2021    8:50 AM 09/30/2021    9:11 AM 07/26/2021   10:54 AM  Vitals with BMI  Height '5\' 11"'  '6\' 0"'  5' 9.8"  Weight 314 lbs 315 lbs 314 lbs 6 oz  BMI 43.81 67.59 16.38  Systolic  466 599  Diastolic  90 80  Pulse  49 61     Physical Exam Vitals reviewed.  Constitutional:      Appearance: Normal appearance. He is obese.  HENT:     Head: Normocephalic and atraumatic.  Neck:     Vascular: No carotid bruit.  Cardiovascular:     Rate and Rhythm: Normal rate. Rhythm irregular.     Pulses: Normal pulses.     Heart sounds: No murmur heard.    No friction rub. No gallop.  Pulmonary:     Effort: Pulmonary effort is  normal.     Breath sounds: Normal breath sounds.  Abdominal:     General: Bowel sounds are normal.     Palpations: Abdomen is soft.  Musculoskeletal:     Right lower leg: No edema.     Left lower leg: No edema.  Skin:    General: Skin is warm and dry.  Neurological:     Mental Status: He is alert.   Medications and allergies   Allergies  Allergen Reactions  . Pseudoephedrine Nausea And Vomiting  . Pseudophed-Chlophedianol-Gg Nausea And Vomiting     Medication list after today's encounter   Current Outpatient Medications:  .  albuterol (VENTOLIN HFA) 108 (90 Base) MCG/ACT inhaler, Inhale 2 puffs into the lungs every 6 (six) hours as needed for wheezing or shortness of breath., Disp: , Rfl:  .  amLODipine (NORVASC) 10 MG tablet, TAKE 1 TABLET(10 MG) BY MOUTH DAILY, Disp: 90 tablet, Rfl: 1 .  atorvastatin (LIPITOR) 20 MG tablet, Take 1 tablet (20 mg total) by mouth daily., Disp: 30 tablet,  Rfl: 11 .  clobetasol cream (TEMOVATE) 0.05 %, Apply topically as needed., Disp: , Rfl:  .  fexofenadine (ALLEGRA) 180 MG tablet, Take 1 tablet (180 mg total) by mouth daily., Disp: 90 tablet, Rfl: 1 .  fexofenadine (ALLEGRA) 180 MG tablet, Take 1 tablet by mouth daily., Disp: , Rfl:  .  hydrALAZINE (APRESOLINE) 50 MG tablet, Take 1 tablet (50 mg total) by mouth in the morning and at bedtime., Disp: 60 tablet, Rfl: 2 .  Insulin Pen Needle (NOVOFINE PEN NEEDLE) 32G X 6 MM MISC, Use with ozempic, Disp: 100 each, Rfl: 3 .  losartan (COZAAR) 100 MG tablet, Take 100 mg by mouth daily., Disp: , Rfl:  .  metoprolol succinate (TOPROL XL) 25 MG 24 hr tablet, Take 1 tablet (25 mg total) by mouth at bedtime., Disp: 30 tablet, Rfl: 2 .  montelukast (SINGULAIR) 10 MG tablet, Take 1 tablet (10 mg total) by mouth daily., Disp: 90 tablet, Rfl: 1 .  Semaglutide-Weight Management (WEGOVY) 1 MG/0.5ML SOAJ, Inject 1 mg into the skin once a week., Disp: 2 mL, Rfl: 2 .  Semaglutide-Weight Management (WEGOVY) 1.7 MG/0.75ML SOAJ, Inject 1.7 mg into the skin once a week., Disp: 3 mL, Rfl: 0 .  sildenafil (VIAGRA) 100 MG tablet, Take 100 mg by mouth daily., Disp: , Rfl:  .  spironolactone (ALDACTONE) 25 MG tablet, Take 25 mg by mouth daily., Disp: , Rfl:  .  tacrolimus (PROTOPIC) 0.1 % ointment, Apply topically as needed., Disp: , Rfl:  .  XARELTO 20 MG TABS tablet, Take 20 mg by mouth daily., Disp: , Rfl:   Laboratory examination:   Lab Results  Component Value Date   NA CANCELED 07/26/2021   K 3.9 07/26/2021   CO2 23 07/26/2021   GLUCOSE 81 07/26/2021   BUN 13 07/26/2021   CREATININE 1.15 07/26/2021   CALCIUM 9.1 07/26/2021   EGFR 79 07/26/2021   GFRNONAA 69 03/18/2020       Latest Ref Rng & Units 07/26/2021   11:48 AM 05/10/2021   11:33 AM 08/26/2020   10:28 AM  CMP  Glucose 70 - 99 mg/dL 81  122  105   BUN 6 - 24 mg/dL '13  13  12   ' Creatinine 0.76 - 1.27 mg/dL 1.15  1.03  1.17   Sodium mmol/L CANCELED   140  141   Potassium 3.5 - 5.2 mmol/L 3.9  3.9  4.2   Chloride  96 - 106 mmol/L 100  102  102   CO2 20 - 29 mmol/L '23  24  23   ' Calcium 8.7 - 10.2 mg/dL 9.1  9.7  9.2   Total Protein 6.0 - 8.5 g/dL  7.5  7.5   Total Bilirubin 0.0 - 1.2 mg/dL  0.4  0.4   Alkaline Phos 44 - 121 IU/L  105  100   AST 0 - 40 IU/L  20  18   ALT 0 - 44 IU/L  26  26       Latest Ref Rng & Units 07/26/2021   11:48 AM 08/26/2020   10:28 AM 03/18/2020   10:49 AM  CBC  WBC 3.4 - 10.8 x10E3/uL 6.1  5.0  6.1   Hemoglobin 13.0 - 17.7 g/dL 14.3  15.2  14.8   Hematocrit 37.5 - 51.0 % 42.3  45.0  44.7   Platelets 150 - 450 x10E3/uL 309  290  277     Lipid Panel Recent Labs    07/26/21 1151 10/10/21 0000  CHOL 166 116  TRIG 91 69  LDLCALC 117* 70  HDL 32* 31*  CHOLHDL 5.2*  --     HEMOGLOBIN A1C Lab Results  Component Value Date   HGBA1C 6.1 (H) 07/26/2021   TSH Recent Labs    07/26/21 1148  TSH 1.750    External labs:     Radiology:    Cardiac Studies:   No results found for this or any previous visit from the past 1095 days.     No results found for this or any previous visit from the past 1095 days.     EKG:     11/08/21 Afib, 106 bpm  Assessment     ICD-10-CM   1. Paroxysmal atrial fibrillation (HCC)  I48.0 EKG 12-Lead    PCV ECHOCARDIOGRAM COMPLETE    PCV MYOCARDIAL PERFUSION WO LEXISCAN    2. Essential hypertension  I10     3. Hyperlipidemia LDL goal <70  E78.5        Orders Placed This Encounter  Procedures  . PCV MYOCARDIAL PERFUSION WO LEXISCAN    Standing Status:   Future    Standing Expiration Date:   01/08/2022  . EKG 12-Lead  . PCV ECHOCARDIOGRAM COMPLETE    Standing Status:   Future    Standing Expiration Date:   11/09/2022    Meds ordered this encounter  Medications  . metoprolol succinate (TOPROL XL) 25 MG 24 hr tablet    Sig: Take 1 tablet (25 mg total) by mouth at bedtime.    Dispense:  30 tablet    Refill:  2  . hydrALAZINE (APRESOLINE) 50 MG  tablet    Sig: Take 1 tablet (50 mg total) by mouth in the morning and at bedtime.    Dispense:  60 tablet    Refill:  2    Medications Discontinued During This Encounter  Medication Reason  . cyclobenzaprine (FLEXERIL) 10 MG tablet   . telmisartan-hydrochlorothiazide (MICARDIS HCT) 80-25 MG tablet   . hydrALAZINE (APRESOLINE) 25 MG tablet   . carvedilol (COREG) 3.125 MG tablet      Recommendations:   Drew Daniels is a 51 y.o. male with hypertension, diabetes, and A-fib [id number] at least 2  Continue current cardiac medications. Encourage low-sodium diet, less than 2000 mg daily. Discontinue Coreg, start Toprol XL Hydralazine increased to 50 mg twice daily Schedule Lexiscan exercise stress test Echocardiogram to be done in office Follow-up  in 4 to 6 weeks or sooner if needed    Floydene Flock, DO, [facility]  11/08/2021, 9:41 AM Office: [phone] Pager: [phone]

## 2021-11-25 ENCOUNTER — Other Ambulatory Visit: Payer: BC Managed Care – PPO

## 2021-11-28 ENCOUNTER — Other Ambulatory Visit: Payer: BC Managed Care – PPO

## 2021-12-14 ENCOUNTER — Other Ambulatory Visit: Payer: BC Managed Care – PPO

## 2021-12-19 ENCOUNTER — Telehealth: Payer: Self-pay

## 2021-12-19 NOTE — Telephone Encounter (Signed)
Called patient to see which medication he is taking (losartan potassium or telmisartan/hydrochloroth) whichever he is not taking should be deleted. LVM for patient.

## 2022-01-05 ENCOUNTER — Ambulatory Visit: Payer: BC Managed Care – PPO | Admitting: Internal Medicine

## 2022-01-20 ENCOUNTER — Telehealth: Payer: Self-pay

## 2022-01-20 NOTE — Telephone Encounter (Signed)
Open error 

## 2022-02-08 ENCOUNTER — Other Ambulatory Visit: Payer: Self-pay | Admitting: Internal Medicine

## 2022-07-06 ENCOUNTER — Other Ambulatory Visit (HOSPITAL_COMMUNITY): Payer: Self-pay

## 2022-07-06 MED ORDER — VITAMIN D3 1.25 MG (50000 UT) PO CAPS
50000.0000 [IU] | ORAL_CAPSULE | ORAL | 0 refills | Status: DC
  Filled 2022-07-06: qty 12, 84d supply, fill #0

## 2022-07-06 MED ORDER — MOUNJARO 5 MG/0.5ML ~~LOC~~ SOAJ
5.0000 mg | SUBCUTANEOUS | 3 refills | Status: AC
  Filled 2022-07-06: qty 2, 28d supply, fill #0

## 2022-07-10 ENCOUNTER — Other Ambulatory Visit (HOSPITAL_COMMUNITY): Payer: Self-pay

## 2022-07-12 ENCOUNTER — Other Ambulatory Visit (HOSPITAL_COMMUNITY): Payer: Self-pay

## 2022-07-13 ENCOUNTER — Other Ambulatory Visit (HOSPITAL_COMMUNITY): Payer: Self-pay

## 2022-07-31 ENCOUNTER — Encounter: Payer: BC Managed Care – PPO | Admitting: Internal Medicine

## 2022-08-08 ENCOUNTER — Encounter: Payer: Self-pay | Admitting: Neurology

## 2022-08-08 ENCOUNTER — Ambulatory Visit: Payer: BC Managed Care – PPO | Admitting: Neurology

## 2022-08-08 VITALS — BP 142/96 | HR 68 | Ht 72.0 in | Wt 362.0 lb

## 2022-08-08 DIAGNOSIS — G4719 Other hypersomnia: Secondary | ICD-10-CM | POA: Diagnosis not present

## 2022-08-08 DIAGNOSIS — G4733 Obstructive sleep apnea (adult) (pediatric): Secondary | ICD-10-CM | POA: Diagnosis not present

## 2022-08-08 DIAGNOSIS — I480 Paroxysmal atrial fibrillation: Secondary | ICD-10-CM | POA: Diagnosis not present

## 2022-08-08 NOTE — Progress Notes (Signed)
SLEEP MEDICINE CLINIC    Provider:  Melvyn Novas, MD  Primary Care Physician:  Lewis Moccasin, MD [address] [place] [address]     Referring Provider: Dow Adolph, Fnp [address] [place],  [place] [address]          Chief Complaint according to patient   Patient presents with:     New Patient (Initial Visit)      Patient see after 5 year hiatus, concern for atrial fib , DM,  and now needing reevaluation of OSA.       HISTORY OF PRESENT ILLNESS:  Drew Daniels is a 52 y.o. male patient who is seen upon referral on 08/08/2022 from Dr Duanne Guess  for a new sleep evaluation:    Chief concern according to patient :  New onset atrial fib, now fully diabetic.    I have the pleasure of seeing Drew Daniels 08/08/22 a right -handed male with a possible sleep disorder.    The patient had the first sleep study in the year 2000, than in 2019 at our [facility]-   The patient had a home sleep test at [facility] in the year 2019.  At the time the patient's BMI was 42, his AHI was 45, his Epworth score was endorsed at 18 out of 24 possible points and his neck circumference was 19 inches.  He was diagnosed with moderately severe apnea with an overall AHI of 29.4/h REM apnea was 43.7/h and non-REM apnea hypopnea index was 26.1/h.  No central apneas were indicated.  He did have bradycardia cardia intermittently his oxygen nadir was 85% but not sustained at low levels.  Sleep architecture was moderately fragmented.  There was REM sleep noted.  For the majority of the time the patient slept in supine position about 270 minutes with an AHI of 30.6/h followed by left lateral sleep with an AHI of 31.1/h.  So there was no positional preference except in prone sleep which was only seen for 23 minutes he reached an AHI of only 5.3/h.  The patient sleeps elevated due to also stresses.  Mean volume of snoring was 40 dB which is low.  I looked through the patient's current medication list he  has no longer been on Wegovy which was also meant to control diabetes as well as weight and has changed to [facility].  He is treated for hypertension, he is treated with Singulair and for atrial fibrillation but is rate controlled on the metoprolol 25 mg at bedtime.  I do not see aspirin on his list of medications nor Plavix.  He does have a Ventolin inhaler.:    Sleep relevant medical history: New diagnosis of atrial fib : Obesity, Diabetic, Nocturia, Insomnia, hypervigilance.  Light sleeper.  Family medical /sleep history: no other family member on CPAP with OSA, insomnia, sleep walkers.    Social history:  Patient is working as a Cytogeneticist and several side jobs, lives in a household with wife and 2 daughters.  The patient currently works/ used to work in shifts( Chief Technology Officer,) Pets : none  Tobacco use; none .  ETOH use ; none ,  Caffeine intake in form of Coffee( /) Soda( /) Tea ( mushroom coffee - 1 a day) .      Sleep habits are as follows: The patient's dinner time is between 6-7 PM. The patient goes to bed at 10 PM and continues to sleep for 5-7 hours, wakes for  every hour bathroom breaks, the first time at 12 AM.  Light sleeper.  The preferred sleep position is orthopneic- sleeps elevated on multiple pillows. ,Dreams are reportedly rare. The patient wakes up with an alarm. 4.30   AM is the usual rise time. He reports not feeling refreshed or restored in AM, with symptoms such as dry mouth, morning headaches, and residual fatigue. Naps are taken when ever there is an opportunity: frequently, lasting from 20 to 40 minutes and are more refreshing.    2019- Consult : Drew Daniels is a 52 y.o. male, seen here in a referral from Dr. Allyne Gee, struggling with morbid obesity, and pre diabetes. He has had a sleep study in [place] 15 years ago, at [facility].  He struggles with loud snoring especially when he has a congested nasal pathway.  He also is a Education officer, museum, working  currently from 5 PM to 5 AM, working 4-5 nights a week. He works 3 jobs.    Chief complaint according to patient :" I am unable to lose weight and I snore"   Sleep habits are as follows: He returns from night shift at 6 AM and is in bed from 8-12 noon. His main job is with the city of [place], 36-40 hours.   His daughters return on the school bus by 3.30 PM and 5 PM. He tries to help with homework and struggles to get another nap in. On Monday he works for [employer] between 9 AM and 3 PM, and working  35-40 hours there, too. Wednesdays he gets off from N and R and sleeps from 4 AM to 11 AM.  Average 4-5 hours of daytime sleep.  He is severely sleep deprived.  His wife insists on TV in the bedroom, he uses a sleep mask and headphones to protect his sleep.  He sleeps on his back-and side. Uses 4 pillows, almost sitting up.      Sleep medical history and family sleep history: OSA has not affected family members, but he suspects his mother is affected. Was a thrid shift worker for 30 years. She sleeps in daytime.    Review of Systems: Out of a complete 14 system review, the patient complains of only the following symptoms, and all other reviewed systems are negative.:   Fatigue, sleepiness , snoring,   fragmented sleep, some times wheezing. orthopnoea.  Allergy- respiratory , seasonal.   Nocturia every hour -    How likely are you to doze in the following situations: 0 = not likely, 1 = slight chance, 2 = moderate chance, 3 = high chance   Sitting and Reading? Watching Television? Sitting inactive in a public place (theater or meeting)? As a passenger in a car for an hour without a break? Lying down in the afternoon when circumstances permit? Sitting and talking to someone? Sitting quietly after lunch without alcohol? In a car, while stopped for a few minutes in traffic?   Total = 13/ 24 points  ( still elevated) takes naps whenever possible.   FSS endorsed at 29/ 63 points.    Social History   Socioeconomic History   Marital status: Married    Spouse name: Not on file   Number of children: 3   Years of education: Not on file   Highest education level: Not on file  Occupational History   Not on file  Tobacco Use   Smoking status: Never   Smokeless tobacco: Never  Vaping Use  Vaping Use: Never used  Substance and Sexual Activity   Alcohol use: No   Drug use: Not Currently   Sexual activity: Never  Other Topics Concern   Not on file  Social History Narrative   Not on file   Social Determinants of Health   Financial Resource Strain: Not on file  Food Insecurity: Not on file  Transportation Needs: Not on file  Physical Activity: Not on file  Stress: Not on file  Social Connections: Not on file    Family History  Problem Relation Age of Onset   Healthy Mother    Lung cancer Father    Liver cancer Father    Diabetes Other    Esophageal cancer Neg Hx    Colon polyps Neg Hx    Colon cancer Neg Hx    Rectal cancer Neg Hx    Stomach cancer Neg Hx     Past Medical History:  Diagnosis Date   Allergy    seasonal allergies   Asthma    hx of - uses inhaler PRN   Diabetes mellitus without complication (HCC)    diet controlled- not on meds at this time (05/07/2020)   Hypertension    on meds   Sleep apnea    sleeps in recliner due to orthopnea    Past Surgical History:  Procedure Laterality Date   ACHILLES TENDON SURGERY Left 2005   COLONOSCOPY  2022   KNEE ARTHROSCOPY Right 1989     Current Outpatient Medications on File Prior to Visit  Medication Sig Dispense Refill   albuterol (VENTOLIN HFA) 108 (90 Base) MCG/ACT inhaler Inhale 2 puffs into the lungs every 6 (six) hours as needed for wheezing or shortness of breath.     amLODipine (NORVASC) 10 MG tablet TAKE 1 TABLET(10 MG) BY MOUTH DAILY 90 tablet 1   atorvastatin (LIPITOR) 20 MG tablet Take 1 tablet (20 mg total) by mouth daily. 30 tablet 11   Cholecalciferol (VITAMIN D3)  1.25 MG (50000 UT) CAPS Take 1 capsule (50,000 Units total) by mouth once a week. 12 capsule 0   clobetasol cream (TEMOVATE) 0.05 % Apply topically as needed.     fexofenadine (ALLEGRA) 180 MG tablet Take 1 tablet (180 mg total) by mouth daily. 90 tablet 1   fexofenadine (ALLEGRA) 180 MG tablet Take 1 tablet by mouth daily.     losartan (COZAAR) 100 MG tablet Take 100 mg by mouth daily.     metoprolol succinate (TOPROL-XL) 25 MG 24 hr tablet TAKE 1 TABLET(25 MG) BY MOUTH AT BEDTIME 90 tablet 3   montelukast (SINGULAIR) 10 MG tablet Take 1 tablet (10 mg total) by mouth daily. 90 tablet 1   spironolactone (ALDACTONE) 25 MG tablet Take 25 mg by mouth daily.     tacrolimus (PROTOPIC) 0.1 % ointment Apply topically as needed.     tirzepatide [facility]) 5 MG/0.5ML Pen Inject 5 mg into the skin once a week. 2 mL 3   XARELTO 20 MG TABS tablet Take 20 mg by mouth daily.     No current facility-administered medications on file prior to visit.    Allergies  Allergen Reactions   Pseudoephedrine Nausea And Vomiting   Pseudophed-Chlophedianol-Gg Nausea And Vomiting     DIAGNOSTIC DATA (LABS, IMAGING, TESTING) - I reviewed patient records, labs, notes, testing and imaging myself where available.   ON XERALTO for Atrial fibrillation.   Creatinine 1.4, HBA!C over 6 .   Lab Results  Component Value Date  WBC 6.1 07/26/2021   HGB 14.3 07/26/2021   HCT 42.3 07/26/2021   MCV 89 07/26/2021   PLT 309 07/26/2021      Component Value Date/Time   NA CANCELED 07/26/2021 1148   K 3.9 07/26/2021 1148   CL 100 07/26/2021 1148   CO2 23 07/26/2021 1148   GLUCOSE 81 07/26/2021 1148   BUN 13 07/26/2021 1148   CREATININE 1.15 07/26/2021 1148   CALCIUM 9.1 07/26/2021 1148   PROT 7.5 05/10/2021 1133   ALBUMIN 4.0 (L) 09/30/2021 1023   AST 20 05/10/2021 1133   ALT 26 05/10/2021 1133   ALKPHOS 105 05/10/2021 1133   BILITOT 0.4 05/10/2021 1133   GFRNONAA 69 03/18/2020 1049   GFRAA 80 03/18/2020 1049    Lab Results  Component Value Date   CHOL 116 10/10/2021   HDL 31 (A) 10/10/2021   LDLCALC 70 10/10/2021   TRIG 69 10/10/2021   CHOLHDL 5.2 (H) 07/26/2021   Lab Results  Component Value Date   HGBA1C 6.1 (H) 07/26/2021   Lab Results  Component Value Date   VITAMINB12 910 12/11/2019   Lab Results  Component Value Date   TSH 1.750 07/26/2021    PHYSICAL EXAM:  Today's Vitals   08/08/22 0957 08/08/22 1006  BP: (!) 184/102 (!) 142/96  Pulse: 87 68  Weight: (!) 362 lb (164.2 kg)   Height: 6' (1.829 m)    Body mass index is 49.1 kg/m.   Wt Readings from Last 3 Encounters:  08/08/22 (!) 362 lb (164.2 kg)  11/08/21 (!) 314 lb (142.4 kg)  09/30/21 (!) 315 lb (142.9 kg)     Ht Readings from Last 3 Encounters:  08/08/22 6' (1.829 m)  11/08/21 5\' 11"  (1.803 m)  09/30/21 6' (1.829 m)      General: The patient is awake, alert and appears not in acute distress. The patient is well groomed. Head: Normocephalic, atraumatic. Neck is supple. Mallampati 3 plus. ,  neck circumference:20.5 inches . Nasal airflow  patent.  Retrognathia is not seen.  Dental status: biologic  Cardiovascular:  Regular rate and cardiac rhythm by pulse,  without distended neck veins. Respiratory: Lungs are clear to auscultation.  Skin:  Without evidence of ankle edema, or rash. Trunk: The patient's posture is relaxed. Large abdominal girth    NEUROLOGIC EXAM: The patient is fatigued, but oriented to place and time.   Memory subjective described as intact.  Attention span & concentration ability appears normal.  Speech is fluent,  without  dysarthria, dysphonia or aphasia.  Mood and affect are appropriate.   Cranial nerves: no loss of smell or taste reported  Pupils are equal and briskly reactive to light. Funduscopic exam deferred.  Extraocular movements in vertical and horizontal planes were intact and without nystagmus. No Diplopia. Visual fields by finger perimetry are intact. Hearing was  intact to soft voice and finger rubbing.   Facial sensation intact to fine touch. Facial motor strength is symmetric and tongue and uvula move midline.  Neck ROM : rotation, tilt and flexion extension were normal for age and shoulder shrug was symmetrical.    Motor exam:  Symmetric bulk, tone and ROM.   Normal tone without cog- wheeling, symmetric grip strength .   Sensory:  Fine touch, pinprick and vibration were tested  and  normal.  Proprioception tested in the upper extremities was normal.   Coordination: Rapid alternating movements in the fingers/hands were of normal speed.  The Finger-to-nose maneuver was intact without  evidence of ataxia, dysmetria or tremor.   Gait and station: Patient could rise unassisted from a seated position, walked without assistive device.  Stance is of wider width/ base .  Toe and heel walk were deferred.  Deep tendon reflexes: in the upper and lower extremities are symmetric and intact.  Babinski response was deferred.    ASSESSMENT AND PLAN 52 y.o. male patient of Dr Chauncy Passy  here with:    1) new onset atrial fib in the setting of untreated apnea, untreated obesity hypoventilation,  2) morbid obesity: BMI 49 , neck and oral anatomy all high risk for OSA, OHV.   3) increased creatinine, HBa1c : now diabetic with HTN and increasing stress on the renal function. Also new onset at rial fib, which can be related to untreated OSA.   Repeating a sleep study, I prefer this time in lab sleep study , early arrival, SPLIIT at AHI 10.  Please  fit for a mask, BED 2 consider changing to Bipap if CPAP cannot control apneas. His insurance will change in July- would like to be tested and prescribed PAP before this change.   I plan to follow up either personally or through our NP within 3-6 months.   I would like to thank Lewis Moccasin, MD and Dow Adolph, Fnp [address] [place],  [place] [address] for allowing me to meet with and to take care of  this pleasant patient.   CC: I will share my notes with cardiologist as well, Dr. Rinaldo Cloud.   After spending a total time of  45  minutes face to face and additional time for physical and neurologic examination, review of laboratory studies,  personal review of imaging studies, reports and results of other testing and review of referral information / records as far as provided in visit,   Electronically signed by: Melvyn Novas, MD 08/08/2022 10:29 AM  [facility] Board certified by The [employer] of Sleep Medicine and Diplomate of the [employer] of Sleep Medicine. Board certified In Neurology through the ABPN, Fellow of the [employer] of Neurology.

## 2022-08-08 NOTE — Patient Instructions (Signed)
ASSESSMENT AND PLAN 52 y.o. male patient of Dr Chauncy Passy here with:    1) New onset atrial fib in the setting of untreated apnea, untreated obesity hypoventilation, DM and CKD were identified.   2) morbid obesity: BMI 49 , neck and oral anatomy all high risk for OSA, OHV.   3) increased creatinine, HBa1c : now diabetic with HTN and increasing stress on the renal function. Also new onset at rial fib, which can be related to untreated OSA.   Repeating a sleep study, I prefer this time in lab sleep study , early arrival, SPLIIT at AHI 10.  Please  fit for a mask, BED 2 consider changing to Bipap if CPAP cannot control apneas.   I plan to follow up either personally or through our NP within 3-6 months.   I would like to thank Lewis Moccasin, MD and Dow Adolph, Fnp [address] [place],  [place] [address] for allowing me to meet with and to take care of this pleasant patient.   CC: I will share my notes with cardiologist as well, .

## 2022-08-08 NOTE — Addendum Note (Signed)
Addended by: Melvyn Novas on: 08/08/2022 10:57 AM   Modules accepted: Orders

## 2022-08-15 ENCOUNTER — Telehealth: Payer: Self-pay | Admitting: Neurology

## 2022-08-15 NOTE — Telephone Encounter (Signed)
Split/[facility] BCBS state no auth req for either codes.   Left voicemail for patient to call back to schedule.

## 2022-08-18 ENCOUNTER — Other Ambulatory Visit: Payer: Self-pay | Admitting: Internal Medicine

## 2022-08-30 ENCOUNTER — Institutional Professional Consult (permissible substitution): Payer: BC Managed Care – PPO | Admitting: Neurology

## 2022-09-30 ENCOUNTER — Encounter: Payer: Self-pay | Admitting: Neurology

## 2022-10-03 ENCOUNTER — Telehealth: Payer: Self-pay | Admitting: Neurology

## 2022-10-03 NOTE — Telephone Encounter (Signed)
Split- [facility] pending faxed notes.

## 2022-10-05 NOTE — Telephone Encounter (Signed)
I called [facility] it is still pending in nurse review. They did receive my clinical notes.

## 2022-10-10 ENCOUNTER — Other Ambulatory Visit (HOSPITAL_COMMUNITY): Payer: Self-pay

## 2022-10-10 MED ORDER — VITAMIN D3 1.25 MG (50000 UT) PO CAPS
1.2500 mg | ORAL_CAPSULE | ORAL | 0 refills | Status: AC
  Filled 2022-10-10 – 2022-12-16 (×4): qty 12, 84d supply, fill #0

## 2022-10-11 ENCOUNTER — Other Ambulatory Visit (HOSPITAL_COMMUNITY): Payer: Self-pay

## 2022-10-19 ENCOUNTER — Other Ambulatory Visit (HOSPITAL_COMMUNITY): Payer: Self-pay

## 2022-10-19 MED ORDER — OZEMPIC (0.25 OR 0.5 MG/DOSE) 2 MG/3ML ~~LOC~~ SOPN
0.2500 mg | PEN_INJECTOR | SUBCUTANEOUS | 2 refills | Status: AC
Start: 2022-10-19 — End: ?
  Filled 2022-10-19: qty 3, 28d supply, fill #0

## 2022-10-25 NOTE — Telephone Encounter (Signed)
Split- [facility] Berkley Harvey: [phone] (exp. 10/03/22 tp 12/31/22)   Raye Sorrow message.

## 2022-11-27 ENCOUNTER — Other Ambulatory Visit (HOSPITAL_COMMUNITY): Payer: Self-pay

## 2022-11-27 MED ORDER — OZEMPIC (0.25 OR 0.5 MG/DOSE) 2 MG/3ML ~~LOC~~ SOPN
0.5000 mg | PEN_INJECTOR | SUBCUTANEOUS | 2 refills | Status: AC
  Filled 2022-11-27: qty 3, 28d supply, fill #0

## 2022-12-05 ENCOUNTER — Other Ambulatory Visit: Payer: Self-pay

## 2022-12-06 NOTE — Telephone Encounter (Signed)
[facility] pending faxed for an extension on the auth number for his SS appt that is scheduled for 01/16/23.

## 2022-12-08 ENCOUNTER — Other Ambulatory Visit (HOSPITAL_COMMUNITY): Payer: Self-pay

## 2022-12-11 NOTE — Telephone Encounter (Signed)
Updated Furniture conservator/restorer.  Split- [facility] Berkley Harvey: [id number] (exp. 01/16/23 to 04/16/23)   [facility]/16/23 at 8 pm.

## 2022-12-18 ENCOUNTER — Other Ambulatory Visit (HOSPITAL_COMMUNITY): Payer: Self-pay

## 2022-12-18 ENCOUNTER — Other Ambulatory Visit: Payer: Self-pay

## 2023-01-09 NOTE — Telephone Encounter (Signed)
Noted, pt doesn't have any FU scheduled

## 2023-01-09 NOTE — Telephone Encounter (Signed)
Just an FYI patient canceled his Sleep study appointment that was scheduled for 01/16/2023 due to he stated he did it somewhere else.

## 2023-01-15 ENCOUNTER — Other Ambulatory Visit: Payer: Self-pay | Admitting: Internal Medicine

## 2023-01-15 DIAGNOSIS — I10 Essential (primary) hypertension: Secondary | ICD-10-CM

## 2023-04-26 ENCOUNTER — Other Ambulatory Visit (HOSPITAL_COMMUNITY): Payer: Self-pay

## 2023-10-17 ENCOUNTER — Other Ambulatory Visit: Payer: Self-pay

## 2023-10-17 ENCOUNTER — Other Ambulatory Visit (HOSPITAL_COMMUNITY): Payer: Self-pay

## 2023-10-17 MED ORDER — DICLOFENAC SODIUM 1 % EX GEL
1.0000 | Freq: Four times a day (QID) | CUTANEOUS | 11 refills | Status: AC
  Filled 2023-10-17: qty 500, 30d supply, fill #0

## 2024-04-07 ENCOUNTER — Encounter: Payer: Self-pay | Admitting: Dietician

## 2024-04-07 ENCOUNTER — Encounter: Payer: Self-pay | Attending: Family Medicine | Admitting: Dietician

## 2024-04-07 VITALS — Ht 72.0 in | Wt 334.4 lb

## 2024-04-07 DIAGNOSIS — Z6841 Body Mass Index (BMI) 40.0 and over, adult: Secondary | ICD-10-CM | POA: Insufficient documentation

## 2024-04-07 DIAGNOSIS — E66813 Obesity, class 3: Secondary | ICD-10-CM | POA: Insufficient documentation

## 2024-04-07 NOTE — Progress Notes (Signed)
 Medical Nutrition Therapy  Appointment Start time:  1055  Appointment End time:  1210  Primary concerns today: Weight loss  Referral diagnosis: R63.4 (ICD-10-CM) - Weight loss  Preferred learning style: No preference indicated Learning readiness: Not Ready  NUTRITION ASSESSMENT   Anthropometrics: Ht: 72 Wt: 334.4 lbs BMI: 45.35 kg/m2 Goal Weight: 250-260 lbs  Clinical Medical Hx: Obesity, HTN, HLD, T2DM, OSA Medications: Reviewed Labs (03/25/2024): A1c - 6.0%, Creatinine - 1.34  Notable Signs/Symptoms: N/A   Lifestyle & Dietary Hx Pt reports desire to lose weight, find more energy during the day.  Pt states they have taken samples of Rybelsus  and Fraxiga for their DM,  but insurance would not cover them, not currently taking any DM meds. 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 Pt reports eating inconsistently throughout the day, may skip eating between breakfast and dinner, snacks while working, eating away from home on a regualr basis. Pt reports taking 30 minute fitness classes (HIIT, strength) on Tuesday and Saturday. Pt reports having a walking group they will occasionally walk 3 miles with.   Estimated daily fluid intake: > 98 oz Supplements: Apple Cider Vinegar, Pro/Prebiotic Gummies, Ashwaghanda, Beet root, Magnesium Sleep: OSA on CPAP, waking up tired Stress / self-care: High a/w work Current average weekly physical activity: ADLs, tries to workout 2-3 times a week   24-Hr Dietary Recall First Meal: Grits w/ jelly, 1 egg, 2 slices of bacon, 1 piece of sausage, water Snack: [employer] Second Meal: 1/2 piece salmon, small piece grilled chicken, fried apples, cabbage/collard greens, water Snack: [employer] Third Meal:  Snack:  Beverages: Water    NUTRITION DIAGNOSIS  NB-1.1 Food and nutrition-related knowledge deficit As related  to obesity.  As evidenced by BMI of 45.35 kg/m2, inconsistent dietary pattern, dietary history high in energy dense and packaged/processed foods..   NUTRITION INTERVENTION  Nutrition education (E-1) on the following topics:  Educated patient on the two components of energy balance: Energy in (calories), and energy out (activity). Explain the role of negative energy balance in weight loss. Discussed options with patient to achieve a negative energy balance and how to best control energy in and energy out to accommodate their lifestyle. Educated patient on an appropriate blood pressure goal of <120/80. Educated patient on the relationship between dietary sodium intake and hypertension. Educated patient on common sources of high sodium foods including packaged/processed foods, deli meats, fast foods, pickled food, sports drinks, and canned foods. Educated patient on the potential for kidney and heart complications related to uncontrolled hypertension. Educated patient on the combined effect of hypertension and elevated cholesterol on cardiovascular health. Educated patient on the positive impact of physical activity in lowering blood pressure and improving cardiovascular health.  Educated patient on the pathophysiology of diabetes. This includes why our bodies need circulating blood sugar, the relationship between insulin and blood sugar, and the results of insulin resistance and/or pancreatic insufficiency on the development of diabetes. Educated patient on factors that contribute to elevation of blood sugars, such as stress, illness, injury,and food choices. Discussed the role that physical activity plays in lowering blood sugar. Educate patient on the three main macronutrients. Protein, fats, and carbohydrates. Discussed how each of these macronutrients affect blood sugar levels, especially carbohydrate, and the importance of eating a consistent amount of carbohydrate throughout the day.    Handouts  Provided Include  Heart Healthy Label  Reading Tips Cardiac TLC Nutrition Therapy Diabetes Label Reading Tips Snack Ideas Fruits list   Learning Style & Readiness for Change Teaching method utilized: Visual & Auditory  Demonstrated degree of understanding via: Teach Back  Barriers to learning/adherence to lifestyle change: Contemplating   Goals Established by Pt Keep up the great work exercising! The resistance exercise is helping you burn fat and keep your muscles healthy. Have a palm full of walnuts with a piece of fruit instead of eating a whole bag of walnuts. Keep your fruits to 2-3 servings per day. Continue to choose sugar-free beverages like sparkling water, crystal lite, ZERO sugar sodas.  Avoid sports drinks like Gatorade, Gatorlyte, Powerade due to high sodium content. Look to take in around 2,000 - 2,200 calories each day for weight loss. Reduce fried foods when eating away from home.  Work towards eating three meals a day, about 5-6 hours apart! Use your handouts to help reduce sodium from packaged foods.   MONITORING & EVALUATION Dietary intake, weekly physical activity, and weight change in 2 months.  Next Steps  Patient is to follow up with RD.

## 2024-04-07 NOTE — Patient Instructions (Addendum)
 Keep up the great work exercising! The resistance exercise is helping you burn fat and keep your muscles healthy.  Have a palm full of walnuts with a piece of fruit instead of eating a whole bag of walnuts. Keep your fruits to 2-3 servings per day.  Continue to choose sugar-free beverages like sparkling water, crystal lite, ZERO sugar sodas.  Avoid sports drinks like Gatorade, Gatorlyte, Powerade due to high sodium content.  Look to take in around 2,000 - 2,200 calories each day for weight loss.  Reduce fried foods when eating away from home.   Work towards eating three meals a day, about 5-6 hours apart!  Use your handouts to help reduce sodium from packaged foods.

## 2024-05-26 ENCOUNTER — Encounter: Admitting: Dietician
